# Patient Record
Sex: Male | Born: 1960
Health system: Southern US, Community
[De-identification: ages and names within clinical notes are randomized; demographics above are authoritative.]

## PROBLEM LIST (undated history)

## (undated) DIAGNOSIS — S7290XA Unspecified fracture of unspecified femur, initial encounter for closed fracture: Secondary | ICD-10-CM

## (undated) DIAGNOSIS — E785 Hyperlipidemia, unspecified: Secondary | ICD-10-CM

## (undated) DIAGNOSIS — I10 Essential (primary) hypertension: Secondary | ICD-10-CM

## (undated) HISTORY — DX: Hyperlipidemia, unspecified: E78.5

## (undated) HISTORY — PX: LASIK: SHX215

## (undated) HISTORY — DX: Unspecified fracture of unspecified femur, initial encounter for closed fracture: S72.90XA

---

## 2001-11-19 ENCOUNTER — Ambulatory Visit (HOSPITAL_COMMUNITY): Admission: RE | Admit: 2001-11-19 | Discharge: 2001-11-19 | Payer: Self-pay | Admitting: Orthopedic Surgery

## 2001-11-19 ENCOUNTER — Encounter: Payer: Self-pay | Admitting: Orthopedic Surgery

## 2004-04-21 ENCOUNTER — Ambulatory Visit: Payer: Self-pay | Admitting: Internal Medicine

## 2004-06-03 ENCOUNTER — Ambulatory Visit: Payer: Self-pay | Admitting: Internal Medicine

## 2005-03-03 ENCOUNTER — Ambulatory Visit: Payer: Self-pay | Admitting: Internal Medicine

## 2005-05-20 ENCOUNTER — Ambulatory Visit: Payer: Self-pay | Admitting: Internal Medicine

## 2006-11-25 ENCOUNTER — Telehealth (INDEPENDENT_AMBULATORY_CARE_PROVIDER_SITE_OTHER): Payer: Self-pay | Admitting: *Deleted

## 2007-02-21 ENCOUNTER — Telehealth (INDEPENDENT_AMBULATORY_CARE_PROVIDER_SITE_OTHER): Payer: Self-pay | Admitting: *Deleted

## 2007-06-08 ENCOUNTER — Telehealth (INDEPENDENT_AMBULATORY_CARE_PROVIDER_SITE_OTHER): Payer: Self-pay | Admitting: *Deleted

## 2007-09-05 ENCOUNTER — Telehealth (INDEPENDENT_AMBULATORY_CARE_PROVIDER_SITE_OTHER): Payer: Self-pay | Admitting: *Deleted

## 2007-11-29 ENCOUNTER — Ambulatory Visit: Payer: Self-pay | Admitting: Internal Medicine

## 2007-11-29 DIAGNOSIS — R5383 Other fatigue: Secondary | ICD-10-CM

## 2007-11-29 DIAGNOSIS — E785 Hyperlipidemia, unspecified: Secondary | ICD-10-CM | POA: Insufficient documentation

## 2007-11-29 DIAGNOSIS — R5381 Other malaise: Secondary | ICD-10-CM | POA: Insufficient documentation

## 2007-11-30 LAB — CONVERTED CEMR LAB
ALT: 27 units/L (ref 0–53)
AST: 29 units/L (ref 0–37)
Albumin: 3.7 g/dL (ref 3.5–5.2)
Alkaline Phosphatase: 60 units/L (ref 39–117)
BUN: 19 mg/dL (ref 6–23)
Basophils Absolute: 0 10*3/uL (ref 0.0–0.1)
Basophils Relative: 0.7 % (ref 0.0–3.0)
Bilirubin, Direct: 0.1 mg/dL (ref 0.0–0.3)
CO2: 28 meq/L (ref 19–32)
Calcium: 9.2 mg/dL (ref 8.4–10.5)
Chloride: 105 meq/L (ref 96–112)
Cholesterol: 274 mg/dL (ref 0–200)
Creatinine, Ser: 1.5 mg/dL (ref 0.4–1.5)
Direct LDL: 190.1 mg/dL
Eosinophils Absolute: 0.1 10*3/uL (ref 0.0–0.7)
Eosinophils Relative: 2.5 % (ref 0.0–5.0)
GFR calc Af Amer: 64 mL/min
GFR calc non Af Amer: 53 mL/min
Glucose, Bld: 99 mg/dL (ref 70–99)
HCT: 42.6 % (ref 39.0–52.0)
HDL: 33.3 mg/dL — ABNORMAL LOW (ref >39.0)
Hemoglobin: 14.4 g/dL (ref 13.0–17.0)
Lymphocytes Relative: 45.8 % (ref 12.0–46.0)
MCHC: 33.9 g/dL (ref 30.0–36.0)
MCV: 87.6 fL (ref 78.0–100.0)
Monocytes Absolute: 0.3 10*3/uL (ref 0.1–1.0)
Monocytes Relative: 8.7 % (ref 3.0–12.0)
Neutro Abs: 1.7 10*3/uL (ref 1.4–7.7)
Neutrophils Relative %: 42.3 % — ABNORMAL LOW (ref 43.0–77.0)
PSA: 1.7 ng/mL (ref 0.10–4.00)
Phosphorus: 3 mg/dL (ref 2.3–4.6)
Platelets: 183 10*3/uL (ref 150–400)
Potassium: 4.4 meq/L (ref 3.5–5.1)
RBC: 4.86 M/uL (ref 4.22–5.81)
RDW: 13.3 % (ref 11.5–14.6)
Sodium: 140 meq/L (ref 135–145)
TSH: 0.37 microintl units/mL (ref 0.35–5.50)
Total Bilirubin: 0.9 mg/dL (ref 0.3–1.2)
Total CHOL/HDL Ratio: 8.2
Total Protein: 7.1 g/dL (ref 6.0–8.3)
Triglycerides: 225 mg/dL (ref 0–149)
VLDL: 45 mg/dL — ABNORMAL HIGH (ref 0–40)
WBC: 4 10*3/uL — ABNORMAL LOW (ref 4.5–10.5)

## 2007-12-15 ENCOUNTER — Telehealth: Payer: Self-pay | Admitting: Family Medicine

## 2008-03-19 ENCOUNTER — Telehealth (INDEPENDENT_AMBULATORY_CARE_PROVIDER_SITE_OTHER): Payer: Self-pay | Admitting: *Deleted

## 2008-06-15 ENCOUNTER — Telehealth: Payer: Self-pay | Admitting: Internal Medicine

## 2008-09-19 ENCOUNTER — Telehealth: Payer: Self-pay | Admitting: Internal Medicine

## 2008-12-19 ENCOUNTER — Telehealth: Payer: Self-pay | Admitting: Internal Medicine

## 2009-04-15 ENCOUNTER — Telehealth: Payer: Self-pay | Admitting: Internal Medicine

## 2009-08-07 ENCOUNTER — Telehealth: Payer: Self-pay | Admitting: Internal Medicine

## 2009-09-17 ENCOUNTER — Ambulatory Visit: Payer: Self-pay | Admitting: Internal Medicine

## 2009-09-17 DIAGNOSIS — M549 Dorsalgia, unspecified: Secondary | ICD-10-CM | POA: Insufficient documentation

## 2009-11-18 ENCOUNTER — Telehealth: Payer: Self-pay | Admitting: Internal Medicine

## 2010-02-12 ENCOUNTER — Telehealth: Payer: Self-pay | Admitting: Internal Medicine

## 2010-05-12 ENCOUNTER — Telehealth: Payer: Self-pay | Admitting: Internal Medicine

## 2010-06-03 NOTE — Progress Notes (Signed)
Summary: percocet  Phone Note Refill Request Call back at Home Phone [phone] Message from:  Patient on November 18, 2009 10:17 AM  Refills Requested: Medication #1:  PERCOCET 5-325 MG  TABS 1 two times a day as needed for severe pain.  Method Requested: Pick up at Office Initial call taken by: Melody Comas,  November 18, 2009 10:17 AM  Follow-up for Phone Call        Rx written Follow-up by: Cindee Salt MD,  November 18, 2009 12:23 PM  Additional Follow-up for Phone Call Additional follow up Details #1::        Advised wife ok to pick up script. Additional Follow-up by: Lowella Petties CMA,  November 18, 2009 1:14 PM    Prescriptions: PERCOCET 5-325 MG  TABS (OXYCODONE-ACETAMINOPHEN) 1 two times a day as needed for severe pain  #60 x 0   Entered and Authorized by:   Cindee Salt MD   Signed by:   Cindee Salt MD on 11/18/2009   Method used:   Print then Give to Patient   RxID:   [id number]

## 2010-06-03 NOTE — Progress Notes (Signed)
Summary: refill request for percoct  Phone Note Refill Request Message from:  Patient  Refills Requested: Medication #1:  PERCOCET 5-325 MG  TABS 1 two times a day as needed for severe pain. Initial call taken by: Lowella Petties CMA,  February 12, 2010 12:52 PM  Follow-up for Phone Call        Rx written Follow-up by: Cindee Salt MD,  February 12, 2010 2:14 PM  Additional Follow-up for Phone Call Additional follow up Details #1::        Spoke with patient and advised rx ready for pick-up  Additional Follow-up by: Mervin Hack CMA Duncan Dull),  February 13, 2010 1:59 PM    New/Updated Medications: PERCOCET 5-325 MG  TABS (OXYCODONE-ACETAMINOPHEN) 1 two times a day as needed for severe pain Prescriptions: PERCOCET 5-325 MG  TABS (OXYCODONE-ACETAMINOPHEN) 1 two times a day as needed for severe pain  #60 x 0   Entered and Authorized by:   Cindee Salt MD   Signed by:   Cindee Salt MD on 02/12/2010   Method used:   Print then Give to Patient   RxID:   [id number]

## 2010-06-03 NOTE — Progress Notes (Signed)
Summary: refill request for percocet  Phone Note Refill Request Call back at [phone] Message from:  Patient  Refills Requested: Medication #1:  PERCOCET 5-325 MG  TABS 1 two times a day as needed for severe pain. Please call pt when ready.  Initial call taken by: Lowella Petties CMA,  August 07, 2009 2:00 PM  Follow-up for Phone Call        Rx written Last one until he schedules physical He is getting close to 2 years since last visit Follow-up by: Cindee Salt MD,  August 07, 2009 2:07 PM  Additional Follow-up for Phone Call Additional follow up Details #1::        left message on machine with results, no more refills until pt is seen. Left note on rx at front desk for pt to schedule appt when he pick-up script. DeShannon Smith CMA Duncan Dull)  August 07, 2009 2:43 PM     Prescriptions: PERCOCET 5-325 MG  TABS (OXYCODONE-ACETAMINOPHEN) 1 two times a day as needed for severe pain  #60 x 0   Entered and Authorized by:   Cindee Salt MD   Signed by:   Cindee Salt MD on 08/07/2009   Method used:   Print then Give to Patient   RxID:   [id number]
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
## 2010-06-05 NOTE — Progress Notes (Signed)
Summary: percocet   Phone Note Refill Request Call back at Home Phone [phone] Message from:  Patient on May 12, 2010 4:15 PM  Refills Requested: Medication #1:  PERCOCET 5-325 MG  TABS 1 two times a day as needed for severe pain. Please call patient when rx is ready.    Method Requested: Pick up at Office Initial call taken by: Melody Comas,  May 12, 2010 4:15 PM  Follow-up for Phone Call        Rx written needs to set up PE in the next few months Follow-up by: Cindee Salt MD,  May 13, 2010 9:04 AM  Additional Follow-up for Phone Call Additional follow up Details #1::        Spoke with patient's wife and advised results.  Additional Follow-up by: Mervin Hack CMA Duncan Dull),  May 13, 2010 9:43 AM    Prescriptions: PERCOCET 5-325 MG  TABS (OXYCODONE-ACETAMINOPHEN) 1 two times a day as needed for severe pain  #60 x 0   Entered and Authorized by:   Cindee Salt MD   Signed by:   Cindee Salt MD on 05/13/2010   Method used:   Print then Give to Patient   RxID:   [id number]

## 2010-06-20 ENCOUNTER — Encounter: Payer: Self-pay | Admitting: Internal Medicine

## 2010-06-20 DIAGNOSIS — Z0289 Encounter for other administrative examinations: Secondary | ICD-10-CM

## 2010-07-19 ENCOUNTER — Encounter: Payer: Self-pay | Admitting: Internal Medicine

## 2010-08-11 ENCOUNTER — Other Ambulatory Visit: Payer: Self-pay | Admitting: *Deleted

## 2010-08-11 MED ORDER — OXYCODONE-ACETAMINOPHEN 5-325 MG PO TABS: 1.0000 | ORAL_TABLET | Freq: Two times a day (BID) | ORAL | Status: DC | PRN

## 2010-08-11 NOTE — Telephone Encounter (Signed)
Spoke with patient's wife and advised that rx ready for pick-up

## 2010-08-11 NOTE — Telephone Encounter (Signed)
rx written

## 2010-09-05 ENCOUNTER — Encounter: Payer: Self-pay | Admitting: Internal Medicine

## 2010-09-05 ENCOUNTER — Ambulatory Visit (INDEPENDENT_AMBULATORY_CARE_PROVIDER_SITE_OTHER): Payer: BC Managed Care – PPO | Admitting: Internal Medicine

## 2010-09-05 VITALS — BP 123/80 | HR 58 | Temp 98.4°F | Ht 70.0 in | Wt 236.1 lb

## 2010-09-05 DIAGNOSIS — E785 Hyperlipidemia, unspecified: Secondary | ICD-10-CM

## 2010-09-05 DIAGNOSIS — Z0000 Encounter for general adult medical examination without abnormal findings: Secondary | ICD-10-CM

## 2010-09-05 DIAGNOSIS — M549 Dorsalgia, unspecified: Secondary | ICD-10-CM

## 2010-09-05 DIAGNOSIS — Z125 Encounter for screening for malignant neoplasm of prostate: Secondary | ICD-10-CM

## 2010-09-05 DIAGNOSIS — N529 Male erectile dysfunction, unspecified: Secondary | ICD-10-CM

## 2010-09-05 LAB — HEPATIC FUNCTION PANEL
ALT: 19 U/L (ref 0–53)
AST: 19 U/L (ref 0–37)
Albumin: 3.7 g/dL (ref 3.5–5.2)
Alkaline Phosphatase: 54 U/L (ref 39–117)
Bilirubin, Direct: 0 mg/dL (ref 0.0–0.3)
Total Protein: 6.6 g/dL (ref 6.0–8.3)

## 2010-09-05 LAB — CBC WITH DIFFERENTIAL/PLATELET
Basophils Absolute: 0 10*3/uL (ref 0.0–0.1)
Eosinophils Relative: 2.5 % (ref 0.0–5.0)
HCT: 39.2 % (ref 39.0–52.0)
Lymphs Abs: 1.7 10*3/uL (ref 0.7–4.0)
MCV: 85.3 fl (ref 78.0–100.0)
Monocytes Absolute: 0.3 10*3/uL (ref 0.1–1.0)
Monocytes Relative: 8.4 % (ref 3.0–12.0)
Neutrophils Relative %: 42 % — ABNORMAL LOW (ref 43.0–77.0)
Platelets: 177 10*3/uL (ref 150.0–400.0)
RDW: 14.3 % (ref 11.5–14.6)
WBC: 3.7 10*3/uL — ABNORMAL LOW (ref 4.5–10.5)

## 2010-09-05 LAB — LIPID PANEL
Cholesterol: 266 mg/dL — ABNORMAL HIGH (ref 0–200)
HDL: 39.8 mg/dL (ref >39.00)
Total CHOL/HDL Ratio: 7
Triglycerides: 147 mg/dL (ref 0.0–149.0)
VLDL: 29.4 mg/dL (ref 0.0–40.0)

## 2010-09-05 LAB — BASIC METABOLIC PANEL
Calcium: 8.9 mg/dL (ref 8.4–10.5)
GFR: 77.48 mL/min (ref >60.00)
Glucose, Bld: 94 mg/dL (ref 70–99)
Potassium: 3.9 mEq/L (ref 3.5–5.1)
Sodium: 141 mEq/L (ref 135–145)

## 2010-09-05 LAB — PSA: PSA: 1.77 ng/mL (ref 0.10–4.00)

## 2010-09-05 LAB — LDL CHOLESTEROL, DIRECT: Direct LDL: 198.1 mg/dL

## 2010-09-05 MED ORDER — VARDENAFIL HCL 20 MG PO TABS: 20.0000 mg | ORAL_TABLET | Freq: Every day | ORAL | Status: DC | PRN

## 2010-09-05 NOTE — Progress Notes (Signed)
Subjective:    Patient ID: Colton Miller, male    DOB: 1960/10/20, 50 y.o.   MRN: [id number]  HPI DOing well No new concerns  Still with intermittent left shoulder pain Sometimes with low back pain Occ numbness in fingers May need as much as 2-3 per day (mostly shoulder)  Weight up but now has lost some back again Stress with MILs recent breast cancer and treatments  Current outpatient prescriptions:oxyCODONE-acetaminophen (PERCOCET) 5-325 MG per tablet, Take 1 tablet by mouth 2 (two) times daily as needed for pain. 1 two times a day as needed for severe pain, Disp: 60 tablet, Rfl: 0  Past Medical History  Diagnosis Date  . Hyperlipidemia   . Femur fracture     Past Surgical History  Procedure Date  . Lasik     2004, then right redone 2008    Family History  Problem Relation Age of Onset  . Cancer Paternal Grandmother     lung cancer  . Diabetes Neg Hx   . Coronary artery disease Neg Hx     History   Social History  . Marital Status: Married    Spouse Name: N/A    Number of Children: 1  . Years of Education: N/A   Occupational History  . Banker     [employer]   Social History Main Topics  . Smoking status: Never Smoker   . Smokeless tobacco: Not on file  . Alcohol Use: No  . Drug Use: Not on file  . Sexually Active: Not on file   Other Topics Concern  . Not on file   Social History Narrative  . No narrative on file   Review of Systems  Constitutional: Negative for activity change and fatigue.       Wears seat belt Weight up 20# over the past few years  HENT: Positive for tinnitus. Negative for hearing loss, rhinorrhea, dental problem and postnasal drip.        No sig nasal allergies Regular with the dentist  Eyes: Negative for visual disturbance.       Occ dry eyes and blurriness in evening No diplopia or focal vision loss  Respiratory: Positive for cough.        Some cough with the pollen  Cardiovascular: Negative for chest pain,  palpitations and leg swelling.  Genitourinary: Negative for dysuria, urgency, decreased urine volume and difficulty urinating.       Some ED--interested in meds  Musculoskeletal: Positive for back pain and arthralgias. Negative for joint swelling.  Skin: Negative for rash.       No suspicious lesions  Neurological: Positive for weakness and numbness. Negative for dizziness, syncope and light-headedness.       Occ left arm symptoms related to the shoulder  Hematological: Negative for adenopathy. Does not bruise/bleed easily.  Psychiatric/Behavioral: Negative for sleep disturbance and dysphoric mood. The patient is not nervous/anxious.        Objective:   Physical Exam  Constitutional: He is oriented to person, place, and time. He appears well-developed and well-nourished. No distress.  HENT:  Head: Normocephalic and atraumatic.  Right Ear: External ear normal.  Left Ear: External ear normal.  Mouth/Throat: Oropharynx is clear and moist. No oropharyngeal exudate.       TMs normal  Eyes: Conjunctivae and EOM are normal. Pupils are equal, round, and reactive to light.       Fundi benign  Neck: Normal range of motion. Neck supple. No thyromegaly present.  Cardiovascular:  Normal rate, regular rhythm, normal heart sounds and intact distal pulses.  Exam reveals no gallop.   No murmur heard. Pulmonary/Chest: Effort normal and breath sounds normal. No respiratory distress. He has no wheezes. He has no rales.  Abdominal: Soft. He exhibits no mass. There is no tenderness.  Musculoskeletal: Normal range of motion. He exhibits no edema and no tenderness.  Lymphadenopathy:    He has no cervical adenopathy.  Neurological: He is alert and oriented to person, place, and time. He exhibits normal muscle tone.       Normal strength and gait  Skin: Skin is warm. No rash noted.  Psychiatric: He has a normal mood and affect. His behavior is normal. Judgment and thought content normal.           Assessment & Plan:

## 2010-11-03 ENCOUNTER — Other Ambulatory Visit: Payer: Self-pay | Admitting: *Deleted

## 2010-11-03 MED ORDER — OXYCODONE-ACETAMINOPHEN 5-325 MG PO TABS: 1.0000 | ORAL_TABLET | Freq: Two times a day (BID) | ORAL | Status: DC | PRN

## 2010-11-03 NOTE — Telephone Encounter (Signed)
Spoke with patient and advised results   

## 2011-01-02 ENCOUNTER — Ambulatory Visit: Payer: BC Managed Care – PPO | Admitting: Family Medicine

## 2011-02-11 ENCOUNTER — Other Ambulatory Visit: Payer: Self-pay | Admitting: *Deleted

## 2011-02-11 MED ORDER — OXYCODONE-ACETAMINOPHEN 5-325 MG PO TABS: 1.0000 | ORAL_TABLET | Freq: Two times a day (BID) | ORAL | Status: DC | PRN

## 2011-02-11 NOTE — Telephone Encounter (Signed)
Please call pt when ready, advised patient it will be Thursday afternoon.

## 2011-05-21 ENCOUNTER — Other Ambulatory Visit: Payer: Self-pay

## 2011-05-21 NOTE — Telephone Encounter (Signed)
Pt left v/m at 5:02pm requesting Percocet prescription. Pt request call when rx ready for pick up at [phone] or cell [phone]. Pt last seen 09/2010.Please advise.

## 2011-05-22 MED ORDER — OXYCODONE-ACETAMINOPHEN 5-325 MG PO TABS: 1.0000 | ORAL_TABLET | Freq: Two times a day (BID) | ORAL | Status: DC | PRN

## 2011-05-22 NOTE — Telephone Encounter (Signed)
Left message on machine that rx is ready for pick-up, and it will be at our front desk.  

## 2011-08-25 ENCOUNTER — Other Ambulatory Visit: Payer: Self-pay

## 2011-08-25 MED ORDER — OXYCODONE-ACETAMINOPHEN 5-325 MG PO TABS: 1.0000 | ORAL_TABLET | Freq: Two times a day (BID) | ORAL | Status: DC | PRN

## 2011-08-25 NOTE — Telephone Encounter (Signed)
Pt left v/m requesting written rx for Percocet. Pt last seen 09/05/10. Pt already scheduled CPX 09/08/11.Please call pt at [phone].

## 2011-08-26 NOTE — Telephone Encounter (Signed)
Spoke with patient and advised rx ready for pick-up and it will be at the front desk.  

## 2011-09-08 ENCOUNTER — Ambulatory Visit (INDEPENDENT_AMBULATORY_CARE_PROVIDER_SITE_OTHER): Payer: BC Managed Care – PPO | Admitting: Internal Medicine

## 2011-09-08 ENCOUNTER — Encounter: Payer: Self-pay | Admitting: Internal Medicine

## 2011-09-08 VITALS — BP 102/80 | HR 58 | Temp 98.1°F | Ht 69.0 in | Wt 232.0 lb

## 2011-09-08 DIAGNOSIS — M549 Dorsalgia, unspecified: Secondary | ICD-10-CM

## 2011-09-08 DIAGNOSIS — Z0000 Encounter for general adult medical examination without abnormal findings: Secondary | ICD-10-CM

## 2011-09-08 DIAGNOSIS — E785 Hyperlipidemia, unspecified: Secondary | ICD-10-CM

## 2011-09-08 LAB — LIPID PANEL
Cholesterol: 268 mg/dL — ABNORMAL HIGH (ref 0–200)
HDL: 39.9 mg/dL (ref >39.00)
Triglycerides: 304 mg/dL — ABNORMAL HIGH (ref 0.0–149.0)

## 2011-09-08 LAB — BASIC METABOLIC PANEL
BUN: 14 mg/dL (ref 6–23)
CO2: 26 mEq/L (ref 19–32)
Chloride: 108 mEq/L (ref 96–112)
Glucose, Bld: 93 mg/dL (ref 70–99)
Potassium: 4.1 mEq/L (ref 3.5–5.1)
Sodium: 142 mEq/L (ref 135–145)

## 2011-09-08 LAB — CBC WITH DIFFERENTIAL/PLATELET
Basophils Absolute: 0 10*3/uL (ref 0.0–0.1)
Eosinophils Absolute: 0.1 10*3/uL (ref 0.0–0.7)
HCT: 40.7 % (ref 39.0–52.0)
Hemoglobin: 13.8 g/dL (ref 13.0–17.0)
Lymphs Abs: 2 10*3/uL (ref 0.7–4.0)
MCHC: 33.8 g/dL (ref 30.0–36.0)
MCV: 85.5 fl (ref 78.0–100.0)
Monocytes Absolute: 0.3 10*3/uL (ref 0.1–1.0)
Monocytes Relative: 7.2 % (ref 3.0–12.0)
Neutro Abs: 1.7 10*3/uL (ref 1.4–7.7)
Platelets: 156 10*3/uL (ref 150.0–400.0)
RDW: 14.7 % — ABNORMAL HIGH (ref 11.5–14.6)

## 2011-09-08 LAB — LDL CHOLESTEROL, DIRECT: Direct LDL: 172.9 mg/dL

## 2011-09-08 LAB — HEPATIC FUNCTION PANEL
Albumin: 3.6 g/dL (ref 3.5–5.2)
Alkaline Phosphatase: 55 U/L (ref 39–117)
Total Protein: 6.6 g/dL (ref 6.0–8.3)

## 2011-09-08 MED ORDER — ATORVASTATIN CALCIUM 40 MG PO TABS: 40.0000 mg | ORAL_TABLET | Freq: Every day | ORAL | Status: DC

## 2011-09-08 NOTE — Patient Instructions (Signed)
Please set up blood work in about 6 weeks (lipid, hepatic--272.4) 

## 2011-09-08 NOTE — Assessment & Plan Note (Signed)
Fairly healthy Working on fitness but needs to continue Will check PSA

## 2011-09-08 NOTE — Assessment & Plan Note (Signed)
And left shoulder pain Uses otc NSAIDs then percocet occ

## 2011-09-08 NOTE — Assessment & Plan Note (Signed)
Discussed rx for primary prevention---he will start

## 2011-09-08 NOTE — Progress Notes (Signed)
Subjective:    Patient ID: Colton Miller, male    DOB: 03-Feb-1961, 51 y.o.   MRN: [id number]  HPI Feels well Trying to be more regular with exercise and eat smart No new concerns  Tried levitra----worked well Too expensive though  Chronic but intermittent left shoulder pain Back pain is there at times but not as bad Uses percocet once in a while Occ uses aleve--does this first  Current Outpatient Prescriptions on File Prior to Visit  Medication Sig Dispense Refill  . oxyCODONE-acetaminophen (PERCOCET) 5-325 MG per tablet Take 1 tablet by mouth 2 (two) times daily as needed.  60 tablet  0    No Known Allergies  Past Medical History  Diagnosis Date  . Hyperlipidemia   . Femur fracture     Past Surgical History  Procedure Date  . Lasik     2004, then right redone 2008    Family History  Problem Relation Age of Onset  . Cancer Paternal Grandmother     lung cancer  . Diabetes Neg Hx   . Coronary artery disease Neg Hx     History   Social History  . Marital Status: Married    Spouse Name: N/A    Number of Children: 1  . Years of Education: N/A   Occupational History  . Banker     [name]   Social History Main Topics  . Smoking status: Never Smoker   . Smokeless tobacco: Never Used  . Alcohol Use: No  . Drug Use: Not on file  . Sexually Active: Not on file   Other Topics Concern  . Not on file   Social History Narrative  . No narrative on file   Review of Systems  Constitutional: Negative for fatigue and unexpected weight change.       Wears seat belt  HENT: Negative for hearing loss, congestion, rhinorrhea, dental problem and tinnitus.        Sees dentist regularly  Eyes: Negative for visual disturbance.       No unilateral vision or diplopia Occ brief blurry vision  Respiratory: Negative for cough, chest tightness and shortness of breath.   Cardiovascular: Negative for chest pain, palpitations and leg swelling.  Gastrointestinal: Negative  for nausea, vomiting, abdominal pain, constipation and blood in stool.       No heart burn  Genitourinary: Negative for urgency, frequency and difficulty urinating.       Rare nocturia  Musculoskeletal: Positive for back pain and arthralgias. Negative for joint swelling.  Skin: Negative for rash.       No suspicious areas   Neurological: Negative for dizziness, syncope, weakness, light-headedness, numbness and headaches.  Hematological: Negative for adenopathy. Does not bruise/bleed easily.  Psychiatric/Behavioral: Negative for sleep disturbance and dysphoric mood. The patient is not nervous/anxious.        Objective:   Physical Exam  Constitutional: He is oriented to person, place, and time. He appears well-developed and well-nourished. No distress.  HENT:  Head: Normocephalic and atraumatic.  Right Ear: External ear normal.  Left Ear: External ear normal.  Mouth/Throat: Oropharynx is clear and moist. No oropharyngeal exudate.  Eyes: Conjunctivae and EOM are normal. Pupils are equal, round, and reactive to light.  Neck: Normal range of motion. Neck supple. No thyromegaly present.  Cardiovascular: Normal rate, regular rhythm, normal heart sounds and intact distal pulses.  Exam reveals no gallop.   No murmur heard. Pulmonary/Chest: Effort normal and breath sounds normal. No respiratory distress.  He has no wheezes. He has no rales.  Abdominal: Soft. There is no tenderness.  Musculoskeletal: Normal range of motion. He exhibits no edema and no tenderness.  Lymphadenopathy:    He has no cervical adenopathy.  Neurological: He is alert and oriented to person, place, and time.  Skin: No rash noted. No erythema.  Psychiatric: He has a normal mood and affect. His behavior is normal.          Assessment & Plan:

## 2011-09-10 ENCOUNTER — Encounter: Payer: Self-pay | Admitting: *Deleted

## 2011-09-14 ENCOUNTER — Other Ambulatory Visit: Payer: Self-pay | Admitting: Internal Medicine

## 2011-10-19 ENCOUNTER — Other Ambulatory Visit: Payer: Self-pay | Admitting: Internal Medicine

## 2011-10-19 DIAGNOSIS — E785 Hyperlipidemia, unspecified: Secondary | ICD-10-CM

## 2011-10-20 ENCOUNTER — Other Ambulatory Visit (INDEPENDENT_AMBULATORY_CARE_PROVIDER_SITE_OTHER): Payer: BC Managed Care – PPO

## 2011-10-20 DIAGNOSIS — E785 Hyperlipidemia, unspecified: Secondary | ICD-10-CM

## 2011-10-20 LAB — HEPATIC FUNCTION PANEL
ALT: 37 U/L (ref 0–53)
AST: 31 U/L (ref 0–37)
Albumin: 4.3 g/dL (ref 3.5–5.2)
Alkaline Phosphatase: 61 U/L (ref 39–117)
Bilirubin, Direct: 0.1 mg/dL (ref 0.0–0.3)
Total Bilirubin: 1.4 mg/dL — ABNORMAL HIGH (ref 0.3–1.2)
Total Protein: 7.8 g/dL (ref 6.0–8.3)

## 2011-10-20 LAB — LIPID PANEL
Cholesterol: 155 mg/dL (ref 0–200)
HDL: 48.3 mg/dL
LDL Cholesterol: 80 mg/dL (ref 0–99)
Total CHOL/HDL Ratio: 3
Triglycerides: 132 mg/dL (ref 0.0–149.0)
VLDL: 26.4 mg/dL (ref 0.0–40.0)

## 2011-10-21 ENCOUNTER — Encounter: Payer: Self-pay | Admitting: *Deleted

## 2011-11-27 ENCOUNTER — Other Ambulatory Visit: Payer: Self-pay | Admitting: Internal Medicine

## 2011-11-27 MED ORDER — OXYCODONE-ACETAMINOPHEN 5-325 MG PO TABS: 1.0000 | ORAL_TABLET | Freq: Two times a day (BID) | ORAL | Status: DC | PRN

## 2011-11-27 NOTE — Telephone Encounter (Signed)
LETVAK PATIENT 

## 2011-11-27 NOTE — Telephone Encounter (Signed)
Requesting refill for Percocet.  He is out of the prescription and needs it for the weekend.  Please call patient back.

## 2012-03-04 ENCOUNTER — Other Ambulatory Visit: Payer: Self-pay

## 2012-03-04 MED ORDER — OXYCODONE-ACETAMINOPHEN 5-325 MG PO TABS: 1.0000 | ORAL_TABLET | Freq: Two times a day (BID) | ORAL | Status: DC | PRN

## 2012-03-04 NOTE — Telephone Encounter (Signed)
Notified pt Rx ready for pick up 

## 2012-03-04 NOTE — Telephone Encounter (Signed)
Pt left v/m requesting rx percocet; call when ready for pick up. 

## 2012-03-25 ENCOUNTER — Emergency Department: Payer: Self-pay | Admitting: Emergency Medicine

## 2012-03-25 LAB — COMPREHENSIVE METABOLIC PANEL
Albumin: 4.2 g/dL (ref 3.4–5.0)
Alkaline Phosphatase: 80 U/L (ref 50–136)
Anion Gap: 8 (ref 7–16)
BUN: 14 mg/dL (ref 7–18)
Calcium, Total: 9.5 mg/dL (ref 8.5–10.1)
Chloride: 106 mmol/L (ref 98–107)
Co2: 26 mmol/L (ref 21–32)
Creatinine: 1.33 mg/dL — ABNORMAL HIGH (ref 0.60–1.30)
Glucose: 116 mg/dL — ABNORMAL HIGH (ref 65–99)
Potassium: 3.1 mmol/L — ABNORMAL LOW (ref 3.5–5.1)
SGOT(AST): 34 U/L (ref 15–37)
SGPT (ALT): 62 U/L (ref 12–78)
Total Protein: 8 g/dL (ref 6.4–8.2)

## 2012-03-25 LAB — CBC WITH DIFFERENTIAL/PLATELET
Basophil %: 0.7 %
Eosinophil #: 0 10*3/uL (ref 0.0–0.7)
Eosinophil %: 0.7 %
HCT: 42.1 % (ref 40.0–52.0)
HGB: 14.4 g/dL (ref 13.0–18.0)
Lymphocyte %: 55.4 %
MCH: 29.2 pg (ref 26.0–34.0)
MCHC: 34.1 g/dL (ref 32.0–36.0)
Monocyte %: 7.8 %
Neutrophil #: 2.3 10*3/uL (ref 1.4–6.5)
Neutrophil %: 35.4 %
RBC: 4.93 10*6/uL (ref 4.40–5.90)
WBC: 6.6 10*3/uL (ref 3.8–10.6)

## 2012-03-25 LAB — LIPASE, BLOOD: Lipase: 144 U/L (ref 73–393)

## 2012-03-28 ENCOUNTER — Ambulatory Visit (INDEPENDENT_AMBULATORY_CARE_PROVIDER_SITE_OTHER): Payer: BC Managed Care – PPO | Admitting: Internal Medicine

## 2012-03-28 ENCOUNTER — Encounter: Payer: Self-pay | Admitting: Internal Medicine

## 2012-03-28 ENCOUNTER — Encounter: Payer: Self-pay | Admitting: *Deleted

## 2012-03-28 VITALS — BP 120/90 | HR 66 | Temp 97.9°F | Wt 225.0 lb

## 2012-03-28 DIAGNOSIS — S139XXA Sprain of joints and ligaments of unspecified parts of neck, initial encounter: Secondary | ICD-10-CM

## 2012-03-28 MED ORDER — METHOCARBAMOL 750 MG PO TABS: 750.0000 mg | ORAL_TABLET | Freq: Four times a day (QID) | ORAL | Status: DC | PRN
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
## 2012-06-23 ENCOUNTER — Other Ambulatory Visit: Payer: Self-pay

## 2012-06-23 MED ORDER — OXYCODONE-ACETAMINOPHEN 5-325 MG PO TABS: 1.0000 | ORAL_TABLET | Freq: Two times a day (BID) | ORAL | Status: DC | PRN

## 2012-06-23 NOTE — Telephone Encounter (Signed)
Spoke with patient and advised rx ready for pick-up and it will be at the front desk.  

## 2012-06-23 NOTE — Telephone Encounter (Signed)
Pt left vm requesting rx percocet. Call when ready for pick up. 

## 2012-09-16 ENCOUNTER — Encounter: Payer: Self-pay | Admitting: Internal Medicine

## 2012-09-16 ENCOUNTER — Ambulatory Visit (INDEPENDENT_AMBULATORY_CARE_PROVIDER_SITE_OTHER): Payer: BC Managed Care – PPO | Admitting: Internal Medicine

## 2012-09-16 VITALS — BP 110/70 | HR 81 | Temp 98.7°F | Ht 69.0 in | Wt 208.5 lb

## 2012-09-16 DIAGNOSIS — M549 Dorsalgia, unspecified: Secondary | ICD-10-CM

## 2012-09-16 DIAGNOSIS — Z0000 Encounter for general adult medical examination without abnormal findings: Secondary | ICD-10-CM

## 2012-09-16 DIAGNOSIS — E785 Hyperlipidemia, unspecified: Secondary | ICD-10-CM

## 2012-09-16 LAB — BASIC METABOLIC PANEL
CO2: 28 mEq/L (ref 19–32)
Chloride: 105 mEq/L (ref 96–112)
GFR: 70.39 mL/min (ref >60.00)
Glucose, Bld: 93 mg/dL (ref 70–99)
Potassium: 3.6 mEq/L (ref 3.5–5.1)
Sodium: 140 mEq/L (ref 135–145)

## 2012-09-16 LAB — CBC WITH DIFFERENTIAL/PLATELET
Basophils Absolute: 0 10*3/uL (ref 0.0–0.1)
HCT: 44.8 % (ref 39.0–52.0)
Hemoglobin: 15.1 g/dL (ref 13.0–17.0)
Lymphs Abs: 1.8 10*3/uL (ref 0.7–4.0)
MCV: 85.1 fl (ref 78.0–100.0)
Monocytes Absolute: 0.4 10*3/uL (ref 0.1–1.0)
Monocytes Relative: 9 % (ref 3.0–12.0)
Neutro Abs: 2 10*3/uL (ref 1.4–7.7)
RDW: 14 % (ref 11.5–14.6)

## 2012-09-16 LAB — LIPID PANEL
Cholesterol: 172 mg/dL (ref 0–200)
LDL Cholesterol: 100 mg/dL — ABNORMAL HIGH (ref 0–99)

## 2012-09-16 LAB — HEPATIC FUNCTION PANEL
AST: 18 U/L (ref 0–37)
Albumin: 4.3 g/dL (ref 3.5–5.2)
Alkaline Phosphatase: 58 U/L (ref 39–117)
Total Protein: 7.6 g/dL (ref 6.0–8.3)

## 2012-09-16 NOTE — Assessment & Plan Note (Signed)
Physically healthy but emotional distress Recommended counseling through job's EAP Will defer PSA this year since normal last year

## 2012-09-16 NOTE — Assessment & Plan Note (Signed)
Still needs the percocet intermittently

## 2012-09-16 NOTE — Assessment & Plan Note (Signed)
No problems with the med Will check labs 

## 2012-09-16 NOTE — Progress Notes (Signed)
Subjective:    Patient ID: Col[place] Miller, male    DOB: [birth year], 52 y.o.   MRN: [id number]  HPI Having some marital issues "my wife is like a different person" since change in chronic narcotics Family strife --- blame going on him that she had to move mother back to [place] She is accusing him of having an affair at work (one of his team members that he has to correspond with) She has not been willing to go for counseling (even their pastor)  Has lost a fair bit of weight Stress, etc Not much exercise  Current Outpatient Prescriptions on File Prior to Visit  Medication Sig Dispense Refill  . atorvastatin (LIPITOR) 40 MG tablet Take 1 tablet (40 mg total) by mouth daily.  30 tablet  11  . methocarbamol (ROBAXIN) 750 MG tablet Take 1 tablet (750 mg total) by mouth 4 (four) times daily as needed.  60 tablet  1  . oxyCODONE-acetaminophen (PERCOCET/ROXICET) 5-325 MG per tablet Take 1 tablet by mouth 2 (two) times daily as needed.  60 tablet  0   No current facility-administered medications on file prior to visit.    No Known Allergies  Past Medical History  Diagnosis Date  . Hyperlipidemia   . Femur fracture     Past Surgical History  Procedure Laterality Date  . Lasik      2004, then right redone 2008    Family History  Problem Relation Age of Onset  . Cancer Paternal Grandmother     lung cancer  . Diabetes Neg Hx   . Coronary artery disease Neg Hx     History   Social History  . Marital Status: Married    Spouse Name: N/A    Number of Children: 1  . Years of Education: N/A   Occupational History  . Banker     [place]   Social History Main Topics  . Smoking status: Never Smoker   . Smokeless tobacco: Never Used  . Alcohol Use: No  . Drug Use: Not on file  . Sexually Active: Not on file   Other Topics Concern  . Not on file   Social History Narrative  . No narrative on file   Review of Systems  Constitutional: Positive for fatigue and  unexpected weight change.       Has not felt right with all this stress Wears seat belt  HENT: Positive for tinnitus. Negative for hearing loss, rhinorrhea, dental problem and postnasal drip.        Regular with dentist  Eyes: Negative for visual disturbance.       No unilateral vision loss but gets some blurry vision at times Has seen eye doctor and all was fine  Respiratory: Negative for cough, chest tightness and shortness of breath.   Cardiovascular: Positive for palpitations. Negative for chest pain and leg swelling.       Some palpitations with stress  Gastrointestinal: Negative for nausea, vomiting, abdominal pain, constipation and blood in stool.       No heartburn problems  Endocrine: Negative for cold intolerance and heat intolerance.  Genitourinary: Positive for difficulty urinating. Negative for urgency.       Some dribbling  No sexual problems  Musculoskeletal: Positive for back pain and arthralgias.  Skin: Negative for rash.       No suspicious lesions  Allergic/Immunologic: Negative for environmental allergies and immunocompromised state.  Neurological: Positive for dizziness, numbness and headaches. Negative for syncope and  light-headedness.       Dizziness with stress Some right hand numbness  Hematological: Negative for adenopathy. Does not bruise/bleed easily.  Psychiatric/Behavioral: Positive for sleep disturbance and dysphoric mood. The patient is nervous/anxious.        Stress is affecting job, church, sleep, etc       Objective:   Physical Exam  Constitutional: He is oriented to person, place, and time. He appears well-developed and well-nourished. No distress.  HENT:  Head: Normocephalic and atraumatic.  Right Ear: External ear normal.  Left Ear: External ear normal.  Mouth/Throat: Oropharynx is clear and moist. No oropharyngeal exudate.  Eyes: Conjunctivae and EOM are normal. Pupils are equal, round, and reactive to light.  Neck: Normal range of motion.  Neck supple. No thyromegaly present.  Cardiovascular: Normal rate, regular rhythm, normal heart sounds and intact distal pulses.  Exam reveals no gallop.   No murmur heard. Pulmonary/Chest: Effort normal and breath sounds normal. No respiratory distress. He has no wheezes. He has no rales.  Abdominal: Soft. There is no tenderness.  Musculoskeletal: He exhibits no edema and no tenderness.  Lymphadenopathy:    He has no cervical adenopathy.  Neurological: He is alert and oriented to person, place, and time.  Skin: No rash noted. No erythema.  Psychiatric:  Anxious and distressed over marital problems          Assessment & Plan:

## 2012-09-20 ENCOUNTER — Other Ambulatory Visit: Payer: Self-pay

## 2012-09-20 NOTE — Telephone Encounter (Signed)
pts wife request rx oxycodone apap. Call when ready for pick up.

## 2012-09-20 NOTE — Telephone Encounter (Signed)
Please confirm with him that he is ready for a new prescription I will print it tomorrow after you have spoken to him

## 2012-09-21 MED ORDER — OXYCODONE-ACETAMINOPHEN 5-325 MG PO TABS: 1.0000 | ORAL_TABLET | Freq: Two times a day (BID) | ORAL | Status: DC | PRN

## 2012-09-21 NOTE — Telephone Encounter (Signed)
Yes patient confirmed he needed a rx

## 2012-09-21 NOTE — Telephone Encounter (Signed)
Spoke with patient and advised rx ready for pick-up and it will be at the front desk.  

## 2012-11-11 ENCOUNTER — Other Ambulatory Visit: Payer: Self-pay | Admitting: Internal Medicine

## 2012-12-08 ENCOUNTER — Other Ambulatory Visit: Payer: Self-pay | Admitting: Internal Medicine

## 2013-09-01 ENCOUNTER — Other Ambulatory Visit: Payer: Self-pay | Admitting: Family Medicine

## 2013-09-01 NOTE — Telephone Encounter (Signed)
Dr. Alphonsus SiasLetvak out of office, please advise.  Last filled 09/20/2012.

## 2013-09-03 MED ORDER — OXYCODONE-ACETAMINOPHEN 5-325 MG PO TABS: 1.0000 | ORAL_TABLET | Freq: Two times a day (BID) | ORAL | Status: DC | PRN

## 2013-09-03 NOTE — Telephone Encounter (Signed)
Printed, routed to PCP as FYI.  

## 2013-09-04 NOTE — Telephone Encounter (Signed)
Left message on machine that rx is ready for pick-up, and it will be at our front desk.  

## 2013-09-04 NOTE — Telephone Encounter (Signed)
Was not able to reach patient to notify Rx was ready for pick up.  Rx given to [place].

## 2013-09-19 ENCOUNTER — Ambulatory Visit (INDEPENDENT_AMBULATORY_CARE_PROVIDER_SITE_OTHER): Payer: BC Managed Care – PPO | Admitting: Internal Medicine

## 2013-09-19 ENCOUNTER — Encounter: Payer: Self-pay | Admitting: Internal Medicine

## 2013-09-19 VITALS — BP 110/70 | HR 71 | Temp 97.9°F | Ht 69.0 in | Wt 209.0 lb

## 2013-09-19 DIAGNOSIS — M549 Dorsalgia, unspecified: Secondary | ICD-10-CM

## 2013-09-19 DIAGNOSIS — E785 Hyperlipidemia, unspecified: Secondary | ICD-10-CM

## 2013-09-19 DIAGNOSIS — Z0000 Encounter for general adult medical examination without abnormal findings: Secondary | ICD-10-CM

## 2013-09-19 DIAGNOSIS — Z125 Encounter for screening for malignant neoplasm of prostate: Secondary | ICD-10-CM

## 2013-09-19 LAB — CBC WITH DIFFERENTIAL/PLATELET
BASOS ABS: 0 10*3/uL (ref 0.0–0.1)
Basophils Relative: 0.6 % (ref 0.0–3.0)
EOS PCT: 2.1 % (ref 0.0–5.0)
Eosinophils Absolute: 0.1 10*3/uL (ref 0.0–0.7)
HCT: 43.1 % (ref 39.0–52.0)
Hemoglobin: 14.3 g/dL (ref 13.0–17.0)
LYMPHS ABS: 1.9 10*3/uL (ref 0.7–4.0)
LYMPHS PCT: 48.7 % — AB (ref 12.0–46.0)
MCHC: 33.2 g/dL (ref 30.0–36.0)
MCV: 86.7 fl (ref 78.0–100.0)
MONOS PCT: 8.7 % (ref 3.0–12.0)
Monocytes Absolute: 0.3 10*3/uL (ref 0.1–1.0)
NEUTROS PCT: 39.9 % — AB (ref 43.0–77.0)
Neutro Abs: 1.6 10*3/uL (ref 1.4–7.7)
PLATELETS: 205 10*3/uL (ref 150.0–400.0)
RBC: 4.97 Mil/uL (ref 4.22–5.81)
RDW: 14.3 % (ref 11.5–15.5)
WBC: 3.9 10*3/uL — AB (ref 4.0–10.5)

## 2013-09-19 LAB — LIPID PANEL
Cholesterol: 254 mg/dL — ABNORMAL HIGH (ref 0–200)
HDL: 47.6 mg/dL
LDL Cholesterol: 181 mg/dL — ABNORMAL HIGH (ref 0–99)
Total CHOL/HDL Ratio: 5
Triglycerides: 128 mg/dL (ref 0.0–149.0)
VLDL: 25.6 mg/dL (ref 0.0–40.0)

## 2013-09-19 LAB — COMPREHENSIVE METABOLIC PANEL
ALBUMIN: 4.1 g/dL (ref 3.5–5.2)
ALT: 24 U/L (ref 0–53)
AST: 23 U/L (ref 0–37)
Alkaline Phosphatase: 53 U/L (ref 39–117)
BILIRUBIN TOTAL: 1.1 mg/dL (ref 0.2–1.2)
BUN: 15 mg/dL (ref 6–23)
CALCIUM: 9.2 mg/dL (ref 8.4–10.5)
CHLORIDE: 104 meq/L (ref 96–112)
CO2: 28 meq/L (ref 19–32)
Creatinine, Ser: 1.3 mg/dL (ref 0.4–1.5)
GFR: 71.33 mL/min (ref >60.00)
GLUCOSE: 84 mg/dL (ref 70–99)
Potassium: 4.3 mEq/L (ref 3.5–5.1)
SODIUM: 138 meq/L (ref 135–145)
TOTAL PROTEIN: 7 g/dL (ref 6.0–8.3)

## 2013-09-19 LAB — T4, FREE: Free T4: 1.01 ng/dL (ref 0.60–1.60)

## 2013-09-19 LAB — PSA: PSA: 3.73 ng/mL (ref 0.10–4.00)

## 2013-09-19 LAB — TSH: TSH: 0.52 u[IU]/mL (ref 0.35–4.50)

## 2013-09-19 NOTE — Assessment & Plan Note (Signed)
Discussed core strengthening

## 2013-09-19 NOTE — Assessment & Plan Note (Signed)
Generally healthy Eating better Needs to get back to exercising Will check PSA

## 2013-09-19 NOTE — Progress Notes (Signed)
Subjective:    Patient ID: Miachel RouxHerman A Miller, male    DOB: [birth year], 53 y.o.   MRN: [id number]  HPI Here for physical  Still lots of stress with wife Still accusing, paranoid at times---ongoing stress (legal and otherwise) Changed jobs-no interaction with the woman he had been texting (work related) for 1.5 years Trying to eat right--has stopped most fried foods Not exercising much  Episodic back pain Mostly left lumbar  Will get in jacuzzi, and rest Uses the oxycodone once in a while  Hasn't been on the statin in some time Didn't have refills   Current Outpatient Prescriptions on File Prior to Visit  Medication Sig Dispense Refill  . atorvastatin (LIPITOR) 40 MG tablet TAKE 1 TABLET BY MOUTH DAILY  30 tablet  5  . oxyCODONE-acetaminophen (PERCOCET/ROXICET) 5-325 MG per tablet Take 1 tablet by mouth 2 (two) times daily as needed.  60 tablet  0   No current facility-administered medications on file prior to visit.    No Known Allergies  Past Medical History  Diagnosis Date  . Hyperlipidemia   . Femur fracture     Past Surgical History  Procedure Laterality Date  . Lasik      2004, then right redone 2008    Family History  Problem Relation Age of Onset  . Cancer Paternal Grandmother     lung cancer  . Diabetes Neg Hx   . Coronary artery disease Neg Hx     History   Social History  . Marital Status: Married    Spouse Name: N/A    Number of Children: 1  . Years of Education: N/A   Occupational History  . Banker     [employer]   Social History Main Topics  . Smoking status: Never Smoker   . Smokeless tobacco: Never Used  . Alcohol Use: No  . Drug Use: Not on file  . Sexual Activity: Not on file   Other Topics Concern  . Not on file   Social History Narrative  . No narrative on file   Review of Systems  Constitutional: Negative for fatigue and unexpected weight change.       Wears seat belt  HENT: Negative for dental problem, hearing  loss and tinnitus.        Regular with dentist  Eyes: Negative for visual disturbance.       No diplopia or unilateral vision loss  Respiratory: Negative for cough, chest tightness and shortness of breath.   Cardiovascular: Negative for chest pain, palpitations and leg swelling.  Gastrointestinal: Negative for nausea, vomiting, abdominal pain, constipation and blood in stool.  Endocrine: Negative for cold intolerance and heat intolerance.  Genitourinary: Negative for urgency, frequency and difficulty urinating.       No sexual problems  Musculoskeletal: Positive for back pain. Negative for arthralgias and joint swelling.  Skin: Negative for rash.       No suspicious lesions  Allergic/Immunologic: Negative for environmental allergies and immunocompromised state.  Neurological: Positive for headaches. Negative for dizziness, syncope, weakness, light-headedness and numbness.       Stress headaches at times  Hematological: Negative for adenopathy. Does not bruise/bleed easily.  Psychiatric/Behavioral: Positive for dysphoric mood. Negative for sleep disturbance. The patient is not nervous/anxious.        Mood is an issue due to wife's behavior       Objective:   Physical Exam  Constitutional: He is oriented to person, place, and time. He appears  well-developed and well-nourished. No distress.  HENT:  Head: Normocephalic and atraumatic.  Right Ear: External ear normal.  Left Ear: External ear normal.  Mouth/Throat: Oropharynx is clear and moist. No oropharyngeal exudate.  Eyes: Conjunctivae and EOM are normal. Pupils are equal, round, and reactive to light.  Neck: Normal range of motion. Neck supple. No thyromegaly present.  Cardiovascular: Normal rate, regular rhythm, normal heart sounds and intact distal pulses.  Exam reveals no gallop.   No murmur heard. Pulmonary/Chest: Effort normal and breath sounds normal. No respiratory distress. He has no wheezes. He has no rales.  Abdominal:  Soft. There is no tenderness.  Musculoskeletal: He exhibits no edema and no tenderness.  Lymphadenopathy:    He has no cervical adenopathy.  Neurological: He is alert and oriented to person, place, and time.  Skin: No rash noted. No erythema.  Psychiatric: He has a normal mood and affect. His behavior is normal.          Assessment & Plan:

## 2013-09-19 NOTE — Progress Notes (Signed)
Pre visit review using our clinic review tool, if applicable. No additional management support is needed unless otherwise documented below in the visit note. 

## 2013-09-19 NOTE — Assessment & Plan Note (Signed)
Has been off the statin Will recheck labs Restart if LDL over 160

## 2013-09-27 ENCOUNTER — Encounter: Payer: Self-pay | Admitting: *Deleted

## 2014-02-02 ENCOUNTER — Other Ambulatory Visit: Payer: Self-pay

## 2014-02-02 NOTE — Telephone Encounter (Signed)
Pt left v/m requesting rx percocet. Call when ready for pick up.

## 2014-02-05 MED ORDER — OXYCODONE-ACETAMINOPHEN 5-325 MG PO TABS: 1.0000 | ORAL_TABLET | Freq: Two times a day (BID) | ORAL | Status: DC | PRN

## 2014-02-05 NOTE — Telephone Encounter (Signed)
Pt left v/m requesting status of rx. Left v/m requesting cb. rx has been placed at front desk for pick up.

## 2014-02-05 NOTE — Telephone Encounter (Signed)
Pt called back and notified that rx is at front desk for pick up; pt voiced understanding.

## 2014-06-04 ENCOUNTER — Other Ambulatory Visit: Payer: Self-pay | Admitting: *Deleted

## 2014-06-04 NOTE — Telephone Encounter (Signed)
Patient left a voicemail requesting a refill on Percocet. Last refill 02/05/14 #60. Call when ready for pickup.

## 2014-06-05 MED ORDER — OXYCODONE-ACETAMINOPHEN 5-325 MG PO TABS: 1.0000 | ORAL_TABLET | Freq: Two times a day (BID) | ORAL | Status: DC | PRN

## 2014-06-05 NOTE — Telephone Encounter (Signed)
Patient notified by telephone that script is up front ready for pickup. 

## 2014-08-22 ENCOUNTER — Other Ambulatory Visit: Payer: Self-pay | Admitting: *Deleted

## 2014-08-22 MED ORDER — OXYCODONE-ACETAMINOPHEN 5-325 MG PO TABS: 1.0000 | ORAL_TABLET | Freq: Two times a day (BID) | ORAL | Status: DC | PRN

## 2014-08-22 NOTE — Telephone Encounter (Signed)
Spoke with patient and advised rx ready for pick-up and it will be at the front desk.  

## 2014-08-22 NOTE — Telephone Encounter (Signed)
Pt left voicemail with Triage requesting Rx refill

## 2014-09-21 ENCOUNTER — Encounter: Payer: BLUE CROSS/BLUE SHIELD | Admitting: Internal Medicine

## 2014-12-27 ENCOUNTER — Other Ambulatory Visit: Payer: Self-pay

## 2014-12-27 MED ORDER — OXYCODONE-ACETAMINOPHEN 5-325 MG PO TABS: 1.0000 | ORAL_TABLET | Freq: Two times a day (BID) | ORAL | Status: DC | PRN

## 2014-12-27 NOTE — Telephone Encounter (Signed)
Spoke with patient and advised rx ready for pick-up and it will be at the front desk.  

## 2014-12-27 NOTE — Telephone Encounter (Signed)
Pt left v/m requesting rx oxycodone apap. Call when ready for pick up. rx last printed # 60 on 08/22/14.last seen annual exam on 09/19/13. Pt scheduled CPX on 05/22/2015.

## 2015-05-22 ENCOUNTER — Encounter: Payer: BLUE CROSS/BLUE SHIELD | Admitting: Internal Medicine

## 2015-10-23 ENCOUNTER — Emergency Department (HOSPITAL_COMMUNITY): Payer: BLUE CROSS/BLUE SHIELD

## 2015-10-23 ENCOUNTER — Other Ambulatory Visit: Payer: Self-pay

## 2015-10-23 ENCOUNTER — Encounter (HOSPITAL_COMMUNITY): Payer: Self-pay | Admitting: Vascular Surgery

## 2015-10-23 ENCOUNTER — Emergency Department (HOSPITAL_COMMUNITY)
Admission: EM | Admit: 2015-10-23 | Discharge: 2015-10-23 | Disposition: A | Discharge status: Home / Self Care | Payer: BLUE CROSS/BLUE SHIELD | Attending: Emergency Medicine | Admitting: Emergency Medicine

## 2015-10-23 DIAGNOSIS — R531 Weakness: Secondary | ICD-10-CM | POA: Diagnosis not present

## 2015-10-23 DIAGNOSIS — R51 Headache: Secondary | ICD-10-CM | POA: Diagnosis not present

## 2015-10-23 DIAGNOSIS — K2970 Gastritis, unspecified, without bleeding: Secondary | ICD-10-CM | POA: Diagnosis not present

## 2015-10-23 DIAGNOSIS — M542 Cervicalgia: Secondary | ICD-10-CM | POA: Diagnosis not present

## 2015-10-23 DIAGNOSIS — R200 Anesthesia of skin: Secondary | ICD-10-CM | POA: Diagnosis not present

## 2015-10-23 DIAGNOSIS — R1033 Periumbilical pain: Secondary | ICD-10-CM | POA: Diagnosis not present

## 2015-10-23 DIAGNOSIS — R1013 Epigastric pain: Secondary | ICD-10-CM | POA: Insufficient documentation

## 2015-10-23 DIAGNOSIS — Z7982 Long term (current) use of aspirin: Secondary | ICD-10-CM | POA: Insufficient documentation

## 2015-10-23 DIAGNOSIS — R0602 Shortness of breath: Secondary | ICD-10-CM | POA: Diagnosis not present

## 2015-10-23 DIAGNOSIS — R110 Nausea: Secondary | ICD-10-CM | POA: Diagnosis not present

## 2015-10-23 DIAGNOSIS — R109 Unspecified abdominal pain: Secondary | ICD-10-CM

## 2015-10-23 LAB — COMPREHENSIVE METABOLIC PANEL
ALK PHOS: 58 U/L (ref 38–126)
ALT: 24 U/L (ref 17–63)
AST: 27 U/L (ref 15–41)
Albumin: 3.8 g/dL (ref 3.5–5.0)
Anion gap: 9 (ref 5–15)
BILIRUBIN TOTAL: 1.1 mg/dL (ref 0.3–1.2)
BUN: 8 mg/dL (ref 6–20)
CHLORIDE: 101 mmol/L (ref 101–111)
CO2: 26 mmol/L (ref 22–32)
CREATININE: 1.42 mg/dL — AB (ref 0.61–1.24)
Calcium: 9.3 mg/dL (ref 8.9–10.3)
GFR calc Af Amer: >60 mL/min (ref >60)
GFR, EST NON AFRICAN AMERICAN: 54 mL/min — AB (ref >60)
GLUCOSE: 108 mg/dL — AB (ref 65–99)
POTASSIUM: 3.3 mmol/L — AB (ref 3.5–5.1)
Sodium: 136 mmol/L (ref 135–145)
TOTAL PROTEIN: 7.4 g/dL (ref 6.5–8.1)

## 2015-10-23 LAB — CBC WITH DIFFERENTIAL/PLATELET
Basophils Absolute: 0 10*3/uL (ref 0.0–0.1)
Basophils Relative: 0 %
Eosinophils Absolute: 0 10*3/uL (ref 0.0–0.7)
Eosinophils Relative: 0 %
HCT: 44.2 % (ref 39.0–52.0)
HEMOGLOBIN: 14.3 g/dL (ref 13.0–17.0)
LYMPHS ABS: 1.4 10*3/uL (ref 0.7–4.0)
LYMPHS PCT: 15 %
MCH: 27.7 pg (ref 26.0–34.0)
MCHC: 32.4 g/dL (ref 30.0–36.0)
MCV: 85.5 fL (ref 78.0–100.0)
MONOS PCT: 12 %
Monocytes Absolute: 1.1 10*3/uL — ABNORMAL HIGH (ref 0.1–1.0)
NEUTROS PCT: 73 %
Neutro Abs: 6.8 10*3/uL (ref 1.7–7.7)
Platelets: 169 10*3/uL (ref 150–400)
RBC: 5.17 MIL/uL (ref 4.22–5.81)
RDW: 14.2 % (ref 11.5–15.5)
WBC: 9.3 10*3/uL (ref 4.0–10.5)

## 2015-10-23 LAB — I-STAT TROPONIN, ED: Troponin i, poc: 0 ng/mL (ref 0.00–0.08)

## 2015-10-23 IMAGING — DX DG CHEST 2V
2 series · 2 of 2 positions shown · non-contrast
Comparison: [DATE]

CLINICAL DATA: Epigastric pain radiating to the left shoulder
starting yesterday. Shortness of breath.

EXAM:
CHEST  2 VIEW

[chest pa]
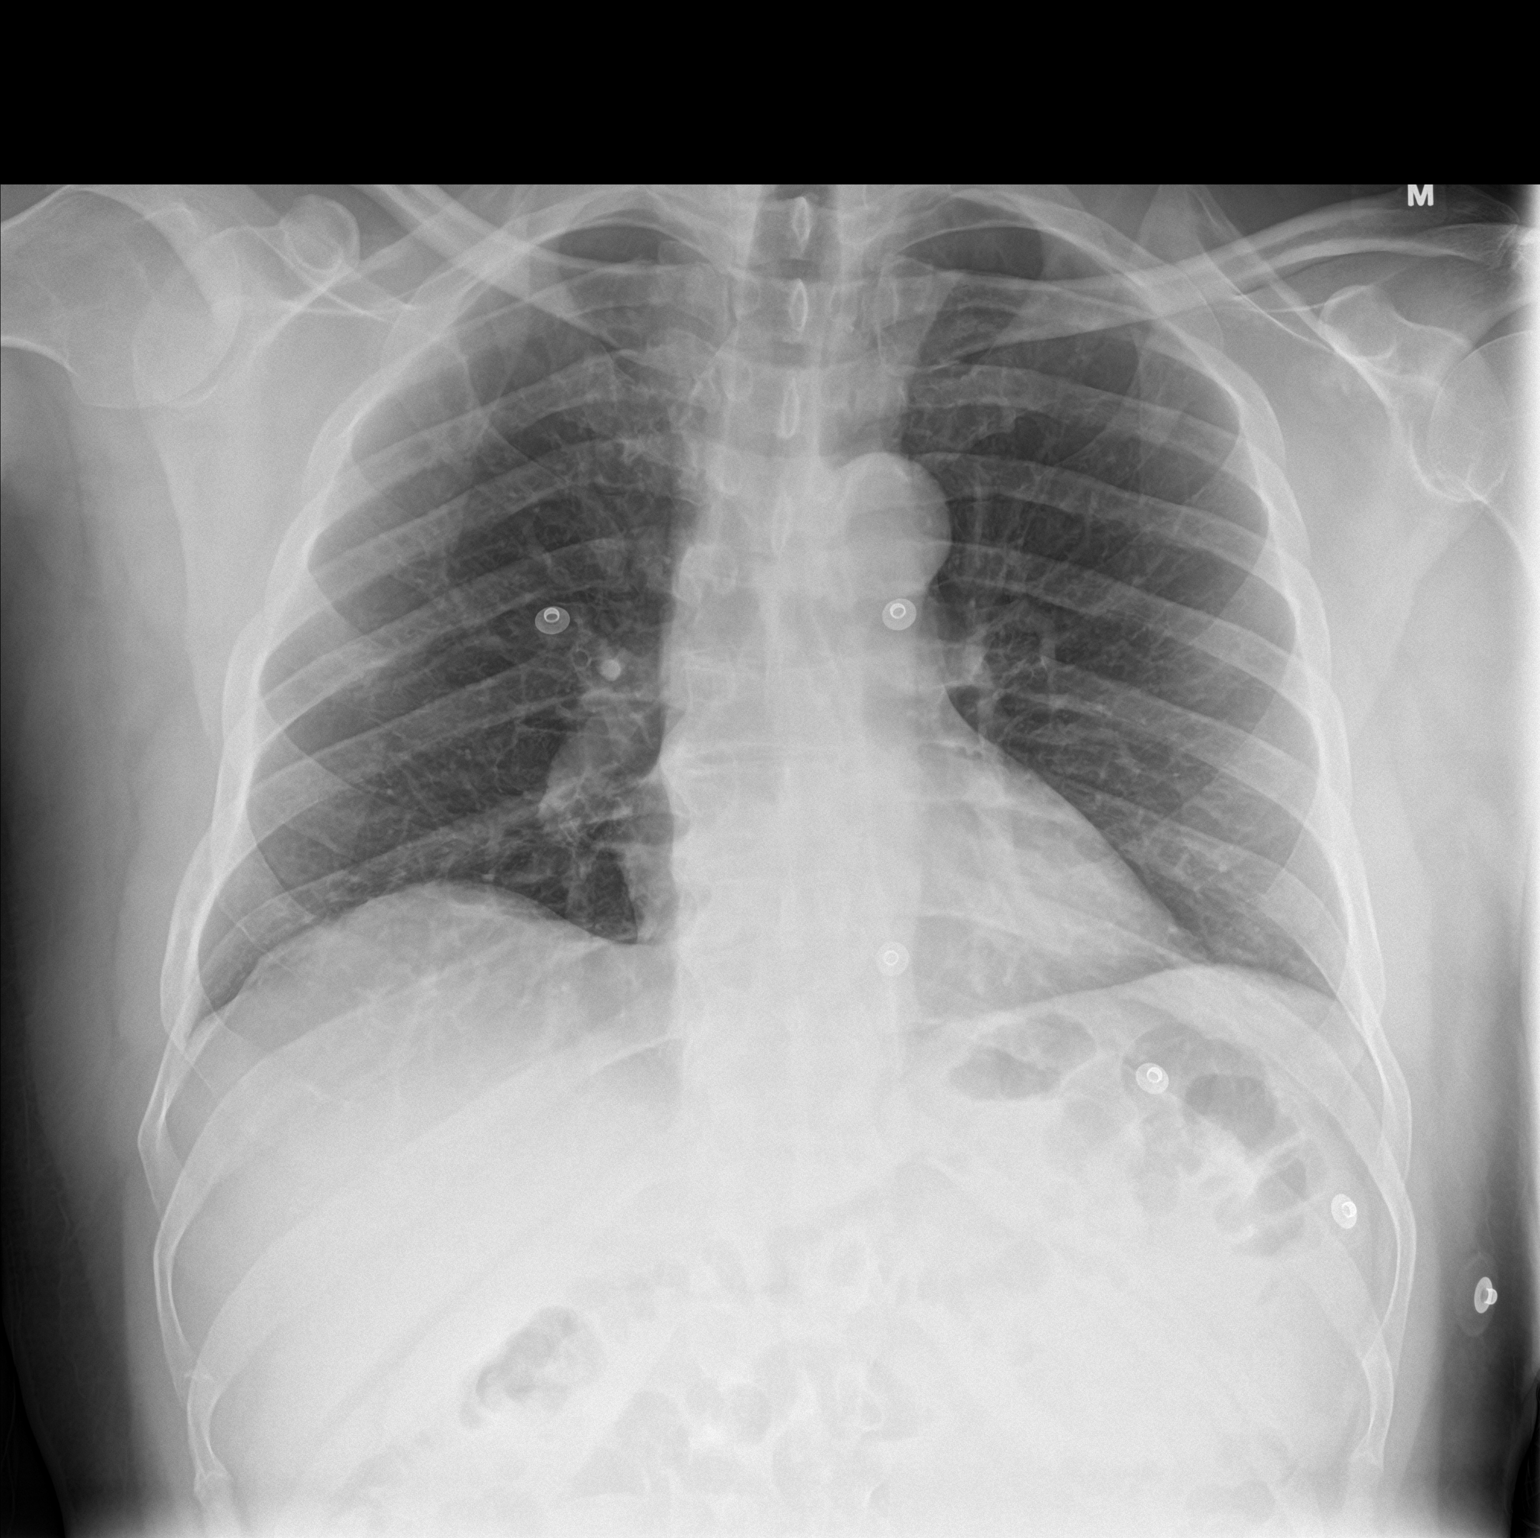

[chest lat]
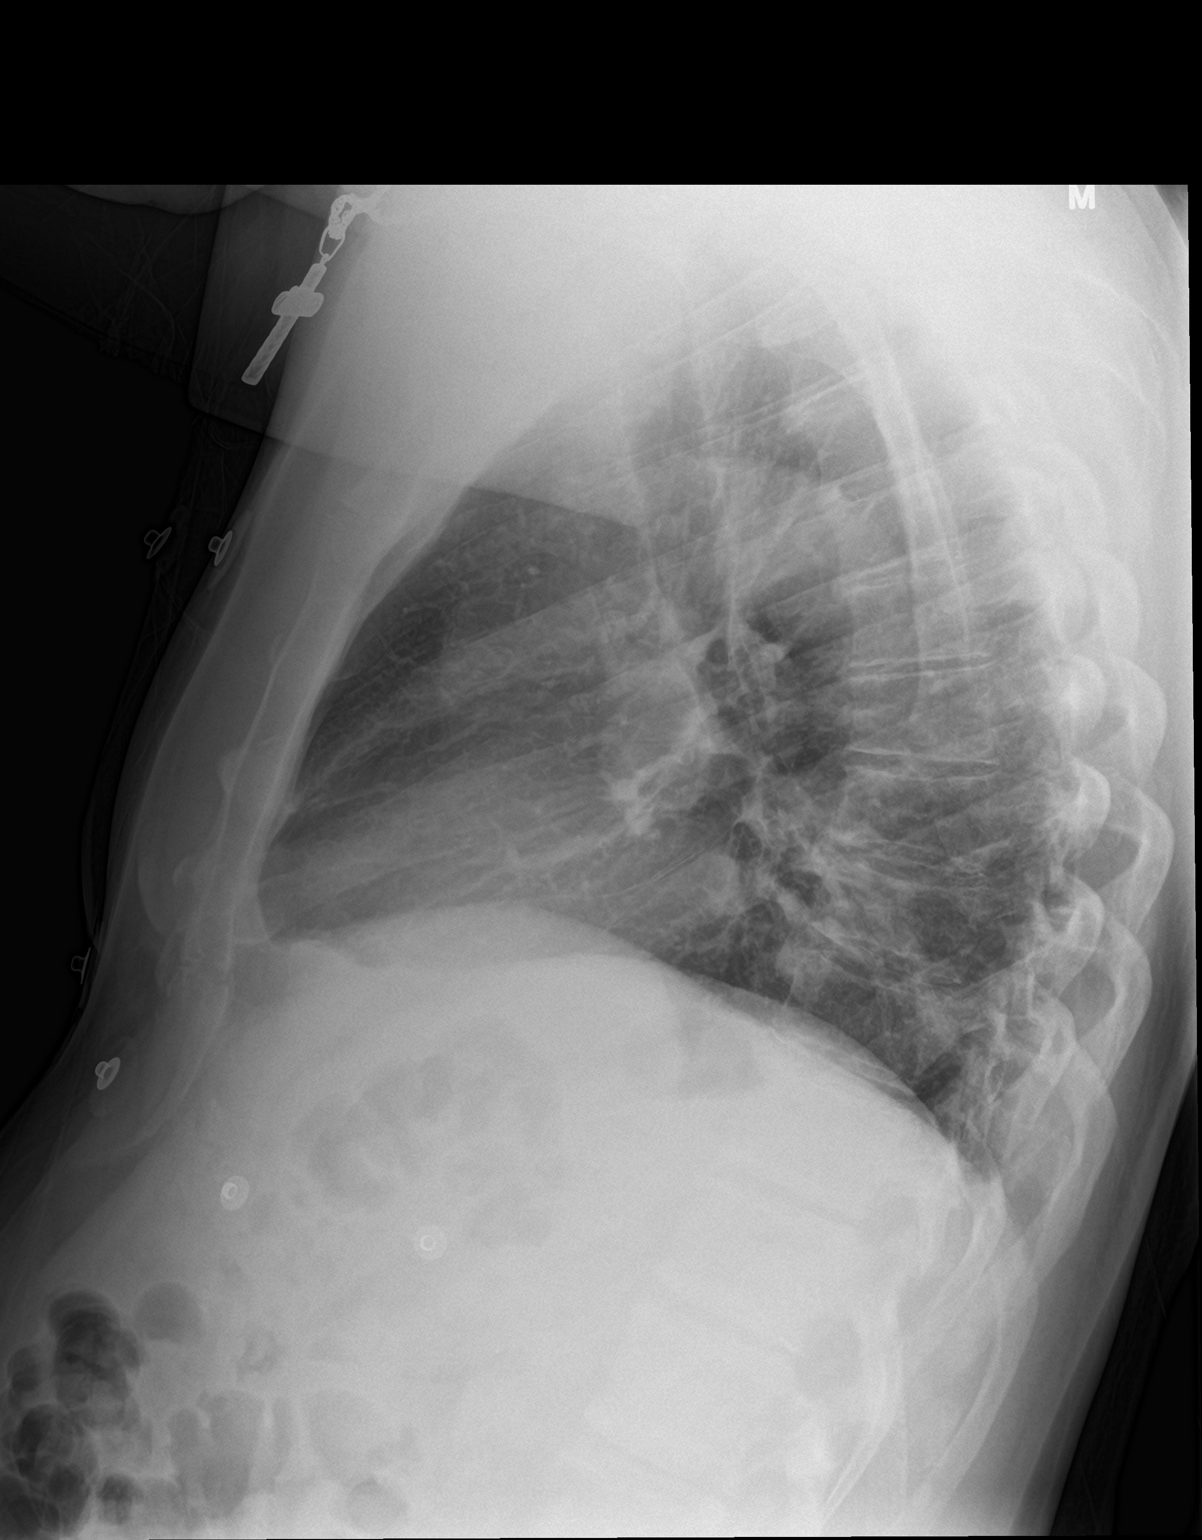

[2 of 2 positions shown; findings below may reference images not displayed]

FINDINGS: Mild atherosclerotic calcification of the aortic arch. Heart size
normal. The lungs appear clear. No pleural effusion.

Mild thoracic spondylosis.
IMPRESSION: 1. Mild atherosclerotic calcification of the aortic arch.
2. No acute findings.
3. Thoracic spondylosis.

## 2015-10-23 MED ORDER — LORAZEPAM 2 MG/ML IJ SOLN
0.5000 mg | Freq: Once | INTRAMUSCULAR | Status: AC
  Administered 2015-10-23: 0.5 mg via INTRAVENOUS
  Filled 2015-10-23: qty 1

## 2015-10-23 MED ORDER — HYDROMORPHONE HCL 1 MG/ML IJ SOLN
1.0000 mg | Freq: Once | INTRAMUSCULAR | Status: AC
  Administered 2015-10-23: 1 mg via INTRAVENOUS
  Filled 2015-10-23: qty 1

## 2015-10-23 MED ORDER — PANTOPRAZOLE SODIUM 20 MG PO TBEC: 20.0000 mg | DELAYED_RELEASE_TABLET | Freq: Every day | ORAL | Status: DC

## 2015-10-23 NOTE — Discharge Instructions (Signed)
Follow-up with your family doctor next week for recheck. 

## 2015-10-23 NOTE — ED Notes (Addendum)
PT reports to the ED for eval of epigastric pain that radiates to his left shoulder. Pt reports the pain started yesterday. Today he developed some associated SOB. Pt reports some nausea but denies any vomiting. 12 lead showed ST elevation in leads V2 and V3. Pt received 324 of ASA and 1 nitro PTA. No changes in pain with the nitro. Pt placed on 4 L of O2 via nasal cannula for comfort. Denies any aggravating or relieving factors. CBG 134 mg/dl and VSS en route. Pt hypertensive at 140/100s en route. Pt A&Ox4, resp e/u, and skin warm and diaphoretic

## 2015-10-23 NOTE — ED Notes (Signed)
Patient is resting comfortably. 

## 2015-10-23 NOTE — ED Notes (Signed)
Patient transported to X-ray 

## 2015-10-23 NOTE — ED Notes (Signed)
Pt returned from XR. Pt hooked back up to monitor. Co-worker at bedside.

## 2015-10-23 NOTE — ED Provider Notes (Signed)
CSN: [id number]     Arrival date & time 10/23/15  1148 History   First MD Initiated Contact with Patient 10/23/15 1149     Chief Complaint  Patient presents with  . Abdominal Pain     (Consider location/radiation/quality/duration/timing/severity/associated sxs/prior Treatment) Patient is a 55 y.o. male presenting with abdominal pain. The history is provided by the patient (Patient states that he was having some epigastric and periumbilical abdominal discomfort and also started out having some weakness in his left arm left leg and numbness).  Abdominal Pain Pain location:  Periumbilical Pain quality: aching   Pain radiates to:  Does not radiate Pain severity:  Moderate Onset quality:  Sudden Timing:  Constant Progression:  Unchanged Chronicity:  New Context: not alcohol use   Relieved by:  Nothing Worsened by:  Nothing tried Associated symptoms: no chest pain, no cough, no diarrhea, no fatigue and no hematuria     Past Medical History  Diagnosis Date  . Hyperlipidemia   . Femur fracture [facility](HCC)    Past Surgical History  Procedure Laterality Date  . Lasik      2004, then right redone 2008   Family History  Problem Relation Age of Onset  . Cancer Paternal Grandmother     lung cancer  . Diabetes Neg Hx   . Coronary artery disease Neg Hx    Social History  Substance Use Topics  . Smoking status: Never Smoker   . Smokeless tobacco: Never Used  . Alcohol Use: No    Review of Systems  Constitutional: Negative for appetite change and fatigue.  HENT: Negative for congestion, ear discharge and sinus pressure.   Eyes: Negative for discharge.  Respiratory: Negative for cough.   Cardiovascular: Negative for chest pain.  Gastrointestinal: Positive for abdominal pain. Negative for diarrhea.  Genitourinary: Negative for frequency and hematuria.  Musculoskeletal: Negative for back pain.  Skin: Negative for rash.  Neurological: Negative for seizures and headaches.   Psychiatric/Behavioral: Negative for hallucinations.      Allergies  Review of patient's allergies indicates no known allergies.  Home Medications   Prior to Admission medications   Medication Sig Start Date End Date Taking? Authorizing Provider  aspirin EC 81 MG tablet Take 324 mg by mouth once.   Yes Historical Provider, MD  aspirin-acetaminophen-caffeine (EXCEDRIN MIGRAINE) [phone] MG tablet Take 1 tablet by mouth every 6 (six) hours as needed for headache.   Yes Historical Provider, MD  nitroGLYCERIN (NITROSTAT) 0.4 MG SL tablet Place 0.4 mg under the tongue every 5 (five) minutes as needed for chest pain.   Yes Historical Provider, MD  Phenyleph-Doxylamine-DM-APAP (ALKA SELTZER PLUS PO) Take 1 Dose by mouth every 8 (eight) hours as needed.   Yes Historical Provider, MD  atorvastatin (LIPITOR) 40 MG tablet TAKE 1 TABLET BY MOUTH DAILY Patient not taking: Reported on 10/23/2015 12/08/12   Karie Schwalbeichard I Letvak, MD  pantoprazole (PROTONIX) 20 MG tablet Take 1 tablet (20 mg total) by mouth daily. 10/23/15   Bethann BerkshireJoseph Marston Mccadden, MD   BP 104/78 mmHg  Pulse 67  Temp(Src) 99.1 F (37.3 C) (Oral)  Resp 17  SpO2 100% Physical Exam  Constitutional: He is oriented to person, place, and time. He appears well-developed.  HENT:  Head: Normocephalic.  Eyes: Conjunctivae and EOM are normal. No scleral icterus.  Neck: Neck supple. No thyromegaly present.  Cardiovascular: Normal rate and regular rhythm.  Exam reveals no gallop and no friction rub.   No murmur heard. Pulmonary/Chest: No stridor. He has  no wheezes. He has no rales. He exhibits no tenderness.  Abdominal: He exhibits no distension. There is tenderness. There is no rebound.  Musculoskeletal:  Mild weakness in his left arm with numbness  Lymphadenopathy:    He has no cervical adenopathy.  Neurological: He is oriented to person, place, and time. He exhibits normal muscle tone. Coordination normal.  Skin: No rash noted. No erythema.   Psychiatric: He has a normal mood and affect. His behavior is normal.    ED Course  Procedures (including critical care time) Labs Review Labs Reviewed  CBC WITH DIFFERENTIAL/PLATELET - Abnormal; Notable for the following:    Monocytes Absolute 1.1 (*)    All other components within normal limits  COMPREHENSIVE METABOLIC PANEL - Abnormal; Notable for the following:    Potassium 3.3 (*)    Glucose, Bld 108 (*)    Creatinine, Ser 1.42 (*)    GFR calc non Af Amer 54 (*)    All other components within normal limits  I-STAT TROPOININ, ED    Imaging Review Dg Chest 2 View  10/23/2015  CLINICAL DATA:  Epigastric pain radiating to the left shoulder starting yesterday. Shortness of breath. EXAM: CHEST  2 VIEW COMPARISON:  03/25/2012 FINDINGS: Mild atherosclerotic calcification of the aortic arch. Heart size normal. The lungs appear clear. No pleural effusion. Mild thoracic spondylosis. IMPRESSION: 1. Mild atherosclerotic calcification of the aortic arch. 2. No acute findings. 3. Thoracic spondylosis. Electronically Signed   By: Gaylyn Rong M.D.   On: 10/23/2015 12:40   Mr Brain Wo Contrast  10/23/2015  CLINICAL DATA:  Pain.  Nausea.  Hypertension. EXAM: MRI HEAD WITHOUT CONTRAST TECHNIQUE: Multiplanar, multiecho pulse sequences of the brain and surrounding structures were obtained without intravenous contrast. COMPARISON:  CT head 03/25/2012 FINDINGS: Ventricle size normal.  Cerebral volume normal. Negative for acute infarct. Scattered hyperintensities in the subcortical and deep white matter bilaterally. Brainstem and basal ganglia normal. Cerebellum normal. Negative for hemorrhage or fluid collection. Negative for mass or edema.  No shift of the midline structures. Circle of Willis patent.  Normal orbit. Pituitary and skull base normal. IMPRESSION: No acute abnormality. Chronic microvascular ischemic changes in the white matter. Electronically Signed   By: Marlan Palau M.D.   On:  10/23/2015 14:22   Mr Cervical Spine Wo Contrast  10/23/2015  CLINICAL DATA:  Epigastric pain. Neck pain. Radiates to LEFT shoulder. EXAM: MRI CERVICAL SPINE WITHOUT CONTRAST TECHNIQUE: Multiplanar, multisequence MR imaging of the cervical spine was performed. No intravenous contrast was administered. COMPARISON:  None. FINDINGS: Alignment: Physiologic. Vertebrae: No fracture, evidence of discitis, or bone lesion. Cord: Normal signal and morphology. Posterior Fossa, vertebral arteries, paraspinal tissues: Negative. Disc levels: No disc protrusion or spinal stenosis. IMPRESSION: Negative exam. Electronically Signed   By: Elsie Stain M.D.   On: 10/23/2015 14:45   I have personally reviewed and evaluated these images and lab results as part of my medical decision-making.   EKG Interpretation   Date/Time:  Wednesday October 23 2015 11:54:03 EDT Ventricular Rate:  92 PR Interval:    QRS Duration: 74 QT Interval:  319 QTC Calculation: 395 R Axis:   34 Text Interpretation:  Sinus rhythm Borderline T abnormalities, inferior  leads Confirmed by Jametta Moorehead  MD, Stephany Poorman [phone]) on 10/23/2015 11:58:18 AM  Also confirmed by Jared Whorley  MD, Jomarie Longs [phone]), editor Whitney Post, Cala Bradford  [phone])  on 10/23/2015 12:19:18 PM      MDM   Final diagnoses:  Abdominal pain in male  Labs unremarkable MRI of head and neck unremarkable. Will treat abdominal discomfort with protonic for GERD. Patient's weakness and numbness in his left arm and left leg seem to be improving. With a normal MRI this could be related to stress. Patient will follow-up with his PCP next week    Bethann Berkshire, MD 10/23/15 1611

## 2015-10-29 ENCOUNTER — Telehealth: Payer: Self-pay

## 2015-10-29 NOTE — Telephone Encounter (Signed)
Left message on VM per DPR to see how he was doing after his ER visit 10-23-15 for Abdominal Pain

## 2015-11-01 ENCOUNTER — Ambulatory Visit (INDEPENDENT_AMBULATORY_CARE_PROVIDER_SITE_OTHER): Payer: BLUE CROSS/BLUE SHIELD | Admitting: Internal Medicine

## 2015-11-01 ENCOUNTER — Encounter: Payer: Self-pay | Admitting: Internal Medicine

## 2015-11-01 VITALS — BP 130/70 | HR 75 | Temp 97.5°F | Wt 212.0 lb

## 2015-11-01 DIAGNOSIS — R208 Other disturbances of skin sensation: Secondary | ICD-10-CM | POA: Diagnosis not present

## 2015-11-01 DIAGNOSIS — R200 Anesthesia of skin: Secondary | ICD-10-CM | POA: Insufficient documentation

## 2015-11-01 DIAGNOSIS — R109 Unspecified abdominal pain: Secondary | ICD-10-CM | POA: Insufficient documentation

## 2015-11-01 DIAGNOSIS — R1084 Generalized abdominal pain: Secondary | ICD-10-CM | POA: Diagnosis not present

## 2015-11-01 NOTE — Progress Notes (Signed)
Subjective:    Patient ID: Col[place], male    DOB: 1961-03-10, 55 y.o.   MRN: [id number]  HPI Here for ER follow up  Was sitting in a meeting--then started getting numbness and tingling and left fingers and then hand Then he felt it in leg Started feeling hot, stomach cramping, etc Went out of meeting to get cold rag and next thing he really knew was paramedics with him on floor  Still had the symptoms so to the ER Reviewed the ER records and MRIs Felt better in ER and went home Took 2 days off and rested and now back to his usual Still feels very tired Will lose train of thought at times  Still has ongoing difficult situation at home Wife still with delusional disorder-- and continues to accuse him of affair, eavesdropping on her, etc etc  Current Outpatient Prescriptions on File Prior to Visit  Medication Sig Dispense Refill  . aspirin EC 81 MG tablet Take 324 mg by mouth once.    Marland Kitchen. aspirin-acetaminophen-caffeine (EXCEDRIN MIGRAINE) 250-250-65 MG tablet Take 1 tablet by mouth every 6 (six) hours as needed for headache.    . pantoprazole (PROTONIX) 20 MG tablet Take 1 tablet (20 mg total) by mouth daily. 30 tablet 0  . atorvastatin (LIPITOR) 40 MG tablet TAKE 1 TABLET BY MOUTH DAILY (Patient not taking: Reported on 10/23/2015) 30 tablet 5  . nitroGLYCERIN (NITROSTAT) 0.4 MG SL tablet Place 0.4 mg under the tongue every 5 (five) minutes as needed for chest pain. Reported on 11/01/2015     No current facility-administered medications on file prior to visit.    No Known Allergies  Past Medical History  Diagnosis Date  . Hyperlipidemia   . Femur fracture [facility](HCC)     Past Surgical History  Procedure Laterality Date  . Lasik      2004, then right redone 2008    Family History  Problem Relation Age of Onset  . Cancer Paternal Grandmother     lung cancer  . Diabetes Neg Hx   . Coronary artery disease Neg Hx     Social History   Social History  . Marital Status:  Married    Spouse Name: N/A  . Number of Children: 1  . Years of Education: N/A   Occupational History  . Banker     [employer]   Social History Main Topics  . Smoking status: Never Smoker   . Smokeless tobacco: Never Used  . Alcohol Use: No  . Drug Use: Not on file  . Sexual Activity: Not on file   Other Topics Concern  . Not on file   Social History Narrative   Review of Systems  Not eating great--appetite not that good Has lost 20# Sleep is difficult---wife often up to 2-3AM yelling, etc     Objective:   Physical Exam  Constitutional: He appears well-developed and well-nourished. No distress.  Neck: Normal range of motion. Neck supple.  Cardiovascular: Normal rate, regular rhythm and normal heart sounds.  Exam reveals no gallop.   No murmur heard. Pulmonary/Chest: Effort normal and breath sounds normal. No respiratory distress. He has no wheezes. He has no rales.  Abdominal: Soft. He exhibits no distension and no mass. There is no rebound and no guarding.  Moderate tenderness both LQs  Musculoskeletal: He exhibits no tenderness.  Lymphadenopathy:    He has no cervical adenopathy.       Right: No inguinal adenopathy present.  Left: No inguinal adenopathy present.  Psychiatric:  Very stressed with his wife's illness          Assessment & Plan:

## 2015-11-01 NOTE — Assessment & Plan Note (Addendum)
Unclear situation Will check ultrasound for starters (creat up slightly so want to avoid contrast) Could be functional but has lost weight and appetite poor If ultrasound unrevealing--will set up with GI

## 2015-11-01 NOTE — Progress Notes (Signed)
Pre visit review using our clinic review tool, if applicable. No additional management support is needed unless otherwise documented below in the visit note. 

## 2015-11-01 NOTE — Assessment & Plan Note (Signed)
Probably related to peripheral nerve since MRIs normal

## 2015-11-06 ENCOUNTER — Other Ambulatory Visit: Payer: Self-pay | Admitting: Internal Medicine

## 2015-11-06 ENCOUNTER — Ambulatory Visit
Admission: RE | Admit: 2015-11-06 | Discharge: 2015-11-06 | Disposition: A | Discharge status: Home / Self Care | Payer: BLUE CROSS/BLUE SHIELD | Source: Ambulatory Visit | Attending: Internal Medicine | Admitting: Internal Medicine

## 2015-11-06 DIAGNOSIS — K760 Fatty (change of) liver, not elsewhere classified: Secondary | ICD-10-CM | POA: Diagnosis not present

## 2015-11-06 DIAGNOSIS — K7689 Other specified diseases of liver: Secondary | ICD-10-CM | POA: Insufficient documentation

## 2015-11-06 DIAGNOSIS — R1084 Generalized abdominal pain: Secondary | ICD-10-CM | POA: Diagnosis not present

## 2015-12-09 ENCOUNTER — Encounter: Payer: Self-pay | Admitting: Gastroenterology

## 2016-01-21 ENCOUNTER — Other Ambulatory Visit: Payer: Self-pay

## 2016-01-21 NOTE — Telephone Encounter (Signed)
Pt left v/m requesting rx oxycodone apap. Call when ready for pick up. Last printed # 60 on 12/27/14. Last seen 11/01/15.

## 2016-01-22 MED ORDER — OXYCODONE-ACETAMINOPHEN 5-325 MG PO TABS: 1.0000 | ORAL_TABLET | Freq: Two times a day (BID) | ORAL | 0 refills | Status: DC | PRN

## 2016-01-22 NOTE — Telephone Encounter (Signed)
Spoke to pt. RX up front 

## 2016-01-23 ENCOUNTER — Encounter: Payer: BLUE CROSS/BLUE SHIELD | Admitting: Internal Medicine

## 2016-01-23 DIAGNOSIS — Z0289 Encounter for other administrative examinations: Secondary | ICD-10-CM

## 2016-02-12 ENCOUNTER — Ambulatory Visit: Payer: BLUE CROSS/BLUE SHIELD | Admitting: Gastroenterology

## 2016-04-21 ENCOUNTER — Other Ambulatory Visit: Payer: Self-pay

## 2016-04-21 NOTE — Telephone Encounter (Signed)
Pt left v/m requesting rx oxycodone apap. Call when ready for pick up. Last printed # 60 on 01/22/16; last seen 11/01/15.

## 2016-04-22 MED ORDER — OXYCODONE-ACETAMINOPHEN 5-325 MG PO TABS: 1.0000 | ORAL_TABLET | Freq: Two times a day (BID) | ORAL | 0 refills | Status: DC | PRN

## 2016-04-22 NOTE — Telephone Encounter (Signed)
Spoke to pt. Rx up front ready for pickup 

## 2016-08-19 ENCOUNTER — Other Ambulatory Visit: Payer: Self-pay

## 2016-08-19 NOTE — Telephone Encounter (Signed)
Pt left v/m requesting rx oxycodone apap. Call when ready for pick up. Last printed # 60 on 04/22/16 and last seen annual 09/19/2013 and last ED f/u 11/01/15.Please advise.

## 2016-08-20 ENCOUNTER — Other Ambulatory Visit: Payer: BLUE CROSS/BLUE SHIELD

## 2016-08-20 ENCOUNTER — Encounter: Payer: Self-pay | Admitting: Internal Medicine

## 2016-08-20 DIAGNOSIS — Z0283 Encounter for blood-alcohol and blood-drug test: Secondary | ICD-10-CM

## 2016-08-20 MED ORDER — OXYCODONE-ACETAMINOPHEN 5-325 MG PO TABS: 1.0000 | ORAL_TABLET | Freq: Two times a day (BID) | ORAL | 0 refills | Status: DC | PRN

## 2016-08-20 NOTE — Telephone Encounter (Signed)
Spoke to pt and informed him Rx is available for pickup from the front desk. Pt advised third party unable to pickup .  

## 2016-08-27 LAB — TOXASSURE SELECT 13 (MW), URINE

## 2017-01-11 ENCOUNTER — Telehealth: Payer: Self-pay | Admitting: Internal Medicine

## 2017-01-11 ENCOUNTER — Encounter: Payer: BLUE CROSS/BLUE SHIELD | Admitting: Internal Medicine

## 2017-01-11 DIAGNOSIS — Z0289 Encounter for other administrative examinations: Secondary | ICD-10-CM

## 2017-01-11 NOTE — Telephone Encounter (Signed)
Please call him to schedule at his earliest convenience.  Find out what happened but I prefer not to charge him

## 2017-01-11 NOTE — Telephone Encounter (Signed)
Patient did not come in for their appointment today for cpe. Please let me know if patient needs to be contacted immediately for follow up or no follow up needed. Do you want to charge the NSF? °

## 2017-01-13 NOTE — Telephone Encounter (Signed)
Left message to reschedule appt

## 2017-01-25 ENCOUNTER — Encounter: Payer: Self-pay | Admitting: Internal Medicine

## 2017-01-25 NOTE — Telephone Encounter (Signed)
Sent letter to reschedule appt.

## 2017-03-12 ENCOUNTER — Telehealth: Payer: Self-pay | Admitting: Internal Medicine

## 2017-03-12 NOTE — Telephone Encounter (Signed)
Patient requesting refill on pain medication / LOV 11/01/2015 / Advised patient that an office visit is required since he missed his appointment in September  2018/  Patient notifed, appointment rescheduled. /

## 2017-03-12 NOTE — Telephone Encounter (Signed)
Copied from CRM [id number]. Topic: Quick Communication - See Telephone Encounter >> Mar 12, 2017  9:56 AM Guinevere FerrariMorris, Krysta Bloomfield E, NT wrote: CRM for notification. See Telephone encounter for: Pt. Is calling for a refill for oxyCODONE-acetaminophen (PERCOCET/ROXICET) 5-325 MG tablet. Pt. Would like to pick up prescription.  03/12/17.

## 2017-03-16 ENCOUNTER — Encounter: Payer: BLUE CROSS/BLUE SHIELD | Admitting: Internal Medicine

## 2017-03-17 ENCOUNTER — Other Ambulatory Visit: Payer: Self-pay | Admitting: Internal Medicine

## 2017-03-17 ENCOUNTER — Other Ambulatory Visit (INDEPENDENT_AMBULATORY_CARE_PROVIDER_SITE_OTHER): Payer: BLUE CROSS/BLUE SHIELD

## 2017-03-17 DIAGNOSIS — Z125 Encounter for screening for malignant neoplasm of prostate: Secondary | ICD-10-CM

## 2017-03-17 DIAGNOSIS — E785 Hyperlipidemia, unspecified: Secondary | ICD-10-CM | POA: Diagnosis not present

## 2017-03-17 LAB — CBC
HCT: 44.7 % (ref 39.0–52.0)
HEMOGLOBIN: 14.7 g/dL (ref 13.0–17.0)
MCHC: 32.9 g/dL (ref 30.0–36.0)
MCV: 88.2 fl (ref 78.0–100.0)
Platelets: 212 10*3/uL (ref 150.0–400.0)
RBC: 5.07 Mil/uL (ref 4.22–5.81)
RDW: 14.8 % (ref 11.5–15.5)
WBC: 4.5 10*3/uL (ref 4.0–10.5)

## 2017-03-17 LAB — LIPID PANEL
CHOLESTEROL: 317 mg/dL — AB (ref 0–200)
HDL: 50.3 mg/dL (ref >39.00)
NonHDL: 266.74
TRIGLYCERIDES: 229 mg/dL — AB (ref 0.0–149.0)
Total CHOL/HDL Ratio: 6
VLDL: 45.8 mg/dL — AB (ref 0.0–40.0)

## 2017-03-17 LAB — COMPREHENSIVE METABOLIC PANEL
ALBUMIN: 4.2 g/dL (ref 3.5–5.2)
ALK PHOS: 57 U/L (ref 39–117)
ALT: 27 U/L (ref 0–53)
AST: 23 U/L (ref 0–37)
BUN: 18 mg/dL (ref 6–23)
CALCIUM: 9.7 mg/dL (ref 8.4–10.5)
CHLORIDE: 105 meq/L (ref 96–112)
CO2: 30 mEq/L (ref 19–32)
Creatinine, Ser: 1.36 mg/dL (ref 0.40–1.50)
GFR: 69.24 mL/min (ref >60.00)
Glucose, Bld: 101 mg/dL — ABNORMAL HIGH (ref 70–99)
POTASSIUM: 4.6 meq/L (ref 3.5–5.1)
SODIUM: 142 meq/L (ref 135–145)
TOTAL PROTEIN: 7.2 g/dL (ref 6.0–8.3)
Total Bilirubin: 0.7 mg/dL (ref 0.2–1.2)

## 2017-03-17 LAB — PSA: PSA: 4.89 ng/mL — ABNORMAL HIGH (ref 0.10–4.00)

## 2017-03-17 LAB — LDL CHOLESTEROL, DIRECT: LDL DIRECT: 204 mg/dL

## 2017-03-18 ENCOUNTER — Ambulatory Visit (INDEPENDENT_AMBULATORY_CARE_PROVIDER_SITE_OTHER): Payer: BLUE CROSS/BLUE SHIELD | Admitting: Internal Medicine

## 2017-03-18 ENCOUNTER — Encounter: Payer: Self-pay | Admitting: Internal Medicine

## 2017-03-18 VITALS — BP 130/86 | HR 72 | Temp 98.3°F | Ht 69.0 in | Wt 220.0 lb

## 2017-03-18 DIAGNOSIS — Z0000 Encounter for general adult medical examination without abnormal findings: Secondary | ICD-10-CM

## 2017-03-18 DIAGNOSIS — Z1211 Encounter for screening for malignant neoplasm of colon: Secondary | ICD-10-CM | POA: Diagnosis not present

## 2017-03-18 DIAGNOSIS — M545 Low back pain, unspecified: Secondary | ICD-10-CM | POA: Insufficient documentation

## 2017-03-18 DIAGNOSIS — E785 Hyperlipidemia, unspecified: Secondary | ICD-10-CM | POA: Diagnosis not present

## 2017-03-18 DIAGNOSIS — R972 Elevated prostate specific antigen [PSA]: Secondary | ICD-10-CM | POA: Diagnosis not present

## 2017-03-18 NOTE — Progress Notes (Signed)
Subjective:    Patient ID: Colton Miller, male    DOB: Oct 16, 1960, 56 y.o.   MRN: [id number]  HPI Here for physical  Has intermittent back pain Uses the oxycodone once in while Reviewed CSRS--only 3 Rx in past 2 years Had brief bad knee pain--but then it stops (seemed to get worse when he used joint supplement that his wife had  Gets scary moments when he yawns--gets spasm in his neck--he has to put pressure on his SCM Clearly just seems like spasm No trouble with his teeth--keeps up with dentist  Some troubles with voiding Delay and urgency. Stream is fine Using saw palmetto with some success  Discussed cholesterol He has been using air fryer more No exercise No FH of CAD Ready to go back on the atorvastatin--no problems with this  Current Outpatient Medications on File Prior to Visit  Medication Sig Dispense Refill  . aspirin EC 81 MG tablet Take 324 mg by mouth once.    Marland Kitchen. aspirin-acetaminophen-caffeine (EXCEDRIN MIGRAINE) 250-250-65 MG tablet Take 1 tablet by mouth every 6 (six) hours as needed for headache.    . oxyCODONE-acetaminophen (PERCOCET/ROXICET) 5-325 MG tablet Take 1 tablet by mouth 2 (two) times daily as needed. 60 tablet 0   No current facility-administered medications on file prior to visit.     No Known Allergies  Past Medical History:  Diagnosis Date  . Femur fracture (HCC)   . Hyperlipidemia     Past Surgical History:  Procedure Laterality Date  . LASIK     2004, then right redone 2008    Family History  Problem Relation Age of Onset  . Cancer Paternal Grandmother        lung cancer  . Stroke Mother   . Heart disease Father   . Cancer Paternal Aunt   . Diabetes Neg Hx   . Coronary artery disease Neg Hx     Social History   Socioeconomic History  . Marital status: Married    Spouse name: Not on file  . Number of children: 1  . Years of education: Not on file  . Highest education level: Not on file  Social Needs  . Financial  resource strain: Not on file  . Food insecurity - worry: Not on file  . Food insecurity - inability: Not on file  . Transportation needs - medical: Not on file  . Transportation needs - non-medical: Not on file  Occupational History  . Occupation: Psychologist, occupationalBanker    Comment: [employer]  Tobacco Use  . Smoking status: Never Smoker  . Smokeless tobacco: Never Used  Substance and Sexual Activity  . Alcohol use: No  . Drug use: Not on file  . Sexual activity: Not on file  Other Topics Concern  . Not on file  Social History Narrative  . Not on file   Review of Systems  Constitutional: Negative for fatigue.       Weight is up a few pounds Wears seat belt  HENT: Negative for dental problem, hearing loss, tinnitus and trouble swallowing.        Keeps up with dentist  Eyes: Negative for visual disturbance.       No diplopia or unilateral vision loss Occasional blurred vision  Respiratory: Negative for cough, chest tightness and shortness of breath.        Occasional tightness under left ribs---relaxes  Cardiovascular: Negative for chest pain, palpitations and leg swelling.  Gastrointestinal: Negative for blood in stool and  constipation.       No heartburn   Endocrine: Negative for polydipsia and polyuria.  Genitourinary: Positive for urgency. Negative for dysuria.       No sexual problems  Musculoskeletal: Positive for neck pain. Negative for joint swelling.  Skin: Negative for rash.       No suspicious lesions  Allergic/Immunologic: Negative for environmental allergies and immunocompromised state.  Neurological: Negative for dizziness, syncope and light-headedness.       Occ stress headaches  Hematological: Negative for adenopathy. Does not bruise/bleed easily.  Psychiatric/Behavioral: Negative for dysphoric mood and sleep disturbance. The patient is not nervous/anxious.        Objective:   Physical Exam  Constitutional: He is oriented to person, place, and time. He appears  well-nourished. No distress.  HENT:  Head: Normocephalic and atraumatic.  Right Ear: External ear normal.  Left Ear: External ear normal.  Mouth/Throat: Oropharynx is clear and moist. No oropharyngeal exudate.  Eyes: Conjunctivae are normal. Pupils are equal, round, and reactive to light.  Neck: No thyromegaly present.  Cardiovascular: Normal rate, regular rhythm, normal heart sounds and intact distal pulses. Exam reveals no gallop.  No murmur heard. Pulmonary/Chest: Effort normal and breath sounds normal. No respiratory distress. He has no wheezes. He has no rales.  Some sternal tenderness  Abdominal: Soft. He exhibits no distension. There is no tenderness. There is no rebound and no guarding.  Musculoskeletal: He exhibits no edema or tenderness.  Lymphadenopathy:    He has no cervical adenopathy.  Neurological: He is alert and oriented to person, place, and time.  Skin: No rash noted. No erythema.  Psychiatric: He has a normal mood and affect. His behavior is normal.          Assessment & Plan:

## 2017-03-18 NOTE — Assessment & Plan Note (Signed)
Has BPH symptoms Likely benign Will recheck in 3 months

## 2017-03-18 NOTE — Assessment & Plan Note (Signed)
High Will restart atorvastatin

## 2017-03-18 NOTE — Patient Instructions (Signed)
DASH Eating Plan DASH stands for "Dietary Approaches to Stop Hypertension." The DASH eating plan is a healthy eating plan that has been shown to reduce high blood pressure (hypertension). It may also reduce your risk for type 2 diabetes, heart disease, and stroke. The DASH eating plan may also help with weight loss. What are tips for following this plan? General guidelines  Avoid eating more than 2,300 mg (milligrams) of salt (sodium) a day. If you have hypertension, you may need to reduce your sodium intake to 1,500 mg a day.  Limit alcohol intake to no more than 1 drink a day for nonpregnant women and 2 drinks a day for men. One drink equals 12 oz of beer, 5 oz of wine, or 1 oz of hard liquor.  Work with your health care provider to maintain a healthy body weight or to lose weight. Ask what an ideal weight is for you.  Get at least 30 minutes of exercise that causes your heart to beat faster (aerobic exercise) most days of the week. Activities may include walking, swimming, or biking.  Work with your health care provider or diet and nutrition specialist (dietitian) to adjust your eating plan to your individual calorie needs. Reading food labels  Check food labels for the amount of sodium per serving. Choose foods with less than 5 percent of the Daily Value of sodium. Generally, foods with less than 300 mg of sodium per serving fit into this eating plan.  To find whole grains, look for the word "whole" as the first word in the ingredient list. Shopping  Buy products labeled as "low-sodium" or "no salt added."  Buy fresh foods. Avoid canned foods and premade or frozen meals. Cooking  Avoid adding salt when cooking. Use salt-free seasonings or herbs instead of table salt or sea salt. Check with your health care provider or pharmacist before using salt substitutes.  Do not fry foods. Cook foods using healthy methods such as baking, boiling, grilling, and broiling instead.  Cook with  heart-healthy oils, such as olive, canola, soybean, or sunflower oil. Meal planning   Eat a balanced diet that includes: ? 5 or more servings of fruits and vegetables each day. At each meal, try to fill half of your plate with fruits and vegetables. ? Up to 6-8 servings of whole grains each day. ? Less than 6 oz of lean meat, poultry, or fish each day. A 3-oz serving of meat is about the same size as a deck of cards. One egg equals 1 oz. ? 2 servings of low-fat dairy each day. ? A serving of nuts, seeds, or beans 5 times each week. ? Heart-healthy fats. Healthy fats called Omega-3 fatty acids are found in foods such as flaxseeds and coldwater fish, like sardines, salmon, and mackerel.  Limit how much you eat of the following: ? Canned or prepackaged foods. ? Food that is high in trans fat, such as fried foods. ? Food that is high in saturated fat, such as fatty meat. ? Sweets, desserts, sugary drinks, and other foods with added sugar. ? Full-fat dairy products.  Do not salt foods before eating.  Try to eat at least 2 vegetarian meals each week.  Eat more home-cooked food and less restaurant, buffet, and fast food.  When eating at a restaurant, ask that your food be prepared with less salt or no salt, if possible. What foods are recommended? The items listed may not be a complete list. Talk with your dietitian about what   dietary choices are best for you. Grains Whole-grain or whole-wheat bread. Whole-grain or whole-wheat pasta. Brown rice. Oatmeal. Quinoa. Bulgur. Whole-grain and low-sodium cereals. Pita bread. Low-fat, low-sodium crackers. Whole-wheat flour tortillas. Vegetables Fresh or frozen vegetables (raw, steamed, roasted, or grilled). Low-sodium or reduced-sodium tomato and vegetable juice. Low-sodium or reduced-sodium tomato sauce and tomato paste. Low-sodium or reduced-sodium canned vegetables. Fruits All fresh, dried, or frozen fruit. Canned fruit in natural juice (without  added sugar). Meat and other protein foods Skinless chicken or turkey. Ground chicken or turkey. Pork with fat trimmed off. Fish and seafood. Egg whites. Dried beans, peas, or lentils. Unsalted nuts, nut butters, and seeds. Unsalted canned beans. Lean cuts of beef with fat trimmed off. Low-sodium, lean deli meat. Dairy Low-fat (1%) or fat-free (skim) milk. Fat-free, low-fat, or reduced-fat cheeses. Nonfat, low-sodium ricotta or cottage cheese. Low-fat or nonfat yogurt. Low-fat, low-sodium cheese. Fats and oils Soft margarine without trans fats. Vegetable oil. Low-fat, reduced-fat, or light mayonnaise and salad dressings (reduced-sodium). Canola, safflower, olive, soybean, and sunflower oils. Avocado. Seasoning and other foods Herbs. Spices. Seasoning mixes without salt. Unsalted popcorn and pretzels. Fat-free sweets. What foods are not recommended? The items listed may not be a complete list. Talk with your dietitian about what dietary choices are best for you. Grains Baked goods made with fat, such as croissants, muffins, or some breads. Dry pasta or rice meal packs. Vegetables Creamed or fried vegetables. Vegetables in a cheese sauce. Regular canned vegetables (not low-sodium or reduced-sodium). Regular canned tomato sauce and paste (not low-sodium or reduced-sodium). Regular tomato and vegetable juice (not low-sodium or reduced-sodium). Pickles. Olives. Fruits Canned fruit in a light or heavy syrup. Fried fruit. Fruit in cream or butter sauce. Meat and other protein foods Fatty cuts of meat. Ribs. Fried meat. Bacon. Sausage. Bologna and other processed lunch meats. Salami. Fatback. Hotdogs. Bratwurst. Salted nuts and seeds. Canned beans with added salt. Canned or smoked fish. Whole eggs or egg yolks. Chicken or turkey with skin. Dairy Whole or 2% milk, cream, and half-and-half. Whole or full-fat cream cheese. Whole-fat or sweetened yogurt. Full-fat cheese. Nondairy creamers. Whipped toppings.  Processed cheese and cheese spreads. Fats and oils Butter. Stick margarine. Lard. Shortening. Ghee. Bacon fat. Tropical oils, such as coconut, palm kernel, or palm oil. Seasoning and other foods Salted popcorn and pretzels. Onion salt, garlic salt, seasoned salt, table salt, and sea salt. Worcestershire sauce. Tartar sauce. Barbecue sauce. Teriyaki sauce. Soy sauce, including reduced-sodium. Steak sauce. Canned and packaged gravies. Fish sauce. Oyster sauce. Cocktail sauce. Horseradish that you find on the shelf. Ketchup. Mustard. Meat flavorings and tenderizers. Bouillon cubes. Hot sauce and Tabasco sauce. Premade or packaged marinades. Premade or packaged taco seasonings. Relishes. Regular salad dressings. Where to find more information:  National Heart, Lung, and Blood Institute: www.nhlbi.nih.gov  American Heart Association: www.heart.org Summary  The DASH eating plan is a healthy eating plan that has been shown to reduce high blood pressure (hypertension). It may also reduce your risk for type 2 diabetes, heart disease, and stroke.  With the DASH eating plan, you should limit salt (sodium) intake to 2,300 mg a day. If you have hypertension, you may need to reduce your sodium intake to 1,500 mg a day.  When on the DASH eating plan, aim to eat more fresh fruits and vegetables, whole grains, lean proteins, low-fat dairy, and heart-healthy fats.  Work with your health care provider or diet and nutrition specialist (dietitian) to adjust your eating plan to your individual   calorie needs. This information is not intended to replace advice given to you by your health care provider. Make sure you discuss any questions you have with your health care provider. Document Released: 04/09/2011 Document Revised: 04/13/2016 Document Reviewed: 04/13/2016 Elsevier Interactive Patient Education  2017 Elsevier Inc.  

## 2017-03-18 NOTE — Assessment & Plan Note (Signed)
Intermittent Uses the oxycodone rarely

## 2017-03-18 NOTE — Assessment & Plan Note (Signed)
Healthy but needs to work on fitness again Will set up colonoscopy Doesn't want flu vaccine Td booster next year

## 2017-03-19 ENCOUNTER — Other Ambulatory Visit: Payer: Self-pay

## 2017-03-19 MED ORDER — OXYCODONE-ACETAMINOPHEN 5-325 MG PO TABS: 1.0000 | ORAL_TABLET | Freq: Two times a day (BID) | ORAL | 0 refills | Status: DC | PRN

## 2017-03-19 NOTE — Telephone Encounter (Signed)
Pt requesting rx oxycodone apap 5-325 mg . Pt seen annual 03/18/17.last UDS 08/20/16; last printed # 60 on 08/20/16. Pt is going out of town for the weekend thru the holiday and pt thought rx would be ready for pick up today; Dr Alphonsus SiasLetvak out of office this afternoon.Please advise.Pt request cb.THANK YOU.

## 2017-03-19 NOTE — Telephone Encounter (Signed)
Printed.  Thanks.  

## 2017-03-19 NOTE — Telephone Encounter (Signed)
I called pt to let know rx at front desk for pick up. Pt voiced understanding and on his way to pick up rx now.

## 2017-04-19 ENCOUNTER — Encounter: Payer: Self-pay | Admitting: Internal Medicine

## 2017-05-31 ENCOUNTER — Other Ambulatory Visit: Payer: Self-pay | Admitting: Internal Medicine

## 2017-05-31 DIAGNOSIS — Z1211 Encounter for screening for malignant neoplasm of colon: Secondary | ICD-10-CM

## 2017-06-07 ENCOUNTER — Telehealth: Payer: Self-pay

## 2017-06-07 NOTE — Telephone Encounter (Signed)
Gastroenterology Pre-Procedure Review  Request Date:   Requesting Physician: Dr.    PATIENT REVIEW QUESTIONS: The patient responded to the following health history questions as indicated:    1. Are you having any GI issues? No  2. Do you have a personal history of Polyps? No  3. Do you have a family history of Colon Cancer or Polyps? No  4. Diabetes Mellitus? No  5. Joint replacements in the past 12 months? No  6. Major health problems in the past 3 months? No  7. Any artificial heart valves, MVP, or defibrillator? No     MEDICATIONS & ALLERGIES:    Patient reports the following regarding taking any anticoagulation/antiplatelet therapy:   Plavix, Coumadin, Eliquis, Xarelto, Lovenox, Pradaxa, Brilinta, or Effient? No  Aspirin? No   Patient confirms/reports the following medications:  Current Outpatient Medications  Medication Sig Dispense Refill  . aspirin-acetaminophen-caffeine (EXCEDRIN MIGRAINE) 250-250-65 MG tablet Take 1 tablet by mouth every 6 (six) hours as needed for headache.    . oxyCODONE-acetaminophen (PERCOCET/ROXICET) 5-325 MG tablet Take 1 tablet 2 (two) times daily as needed by mouth. 60 tablet 0   No current facility-administered medications for this visit.     Patient confirms/reports the following allergies:  No Known Allergies  No orders of the defined types were placed in this encounter.   AUTHORIZATION INFORMATION Primary Insurance: 1D#: Group #:  Secondary Insurance: 1D#: Group #:  SCHEDULE INFORMATION: Date: 06/17/17 Time: Location: [facility]

## 2017-06-08 ENCOUNTER — Other Ambulatory Visit: Payer: Self-pay

## 2017-06-08 DIAGNOSIS — Z1211 Encounter for screening for malignant neoplasm of colon: Secondary | ICD-10-CM

## 2017-06-15 ENCOUNTER — Other Ambulatory Visit: Payer: Self-pay

## 2017-06-15 MED ORDER — NA SULFATE-K SULFATE-MG SULF 17.5-3.13-1.6 GM/177ML PO SOLN: 1.0000 | Freq: Once | ORAL | 0 refills | Status: AC

## 2017-06-17 ENCOUNTER — Ambulatory Visit
Admission: RE | Admit: 2017-06-17 | Discharge: 2017-06-17 | Disposition: A | Discharge status: Home / Self Care | Payer: BLUE CROSS/BLUE SHIELD | Source: Ambulatory Visit | Attending: Gastroenterology | Admitting: Gastroenterology

## 2017-06-17 ENCOUNTER — Ambulatory Visit: Payer: BLUE CROSS/BLUE SHIELD | Admitting: Anesthesiology

## 2017-06-17 ENCOUNTER — Encounter: Payer: Self-pay | Admitting: Anesthesiology

## 2017-06-17 ENCOUNTER — Encounter
Admission: RE | Disposition: A | Discharge status: Home / Self Care | Payer: Self-pay | Source: Ambulatory Visit | Attending: Gastroenterology

## 2017-06-17 DIAGNOSIS — K640 First degree hemorrhoids: Secondary | ICD-10-CM | POA: Insufficient documentation

## 2017-06-17 DIAGNOSIS — Z1211 Encounter for screening for malignant neoplasm of colon: Secondary | ICD-10-CM | POA: Diagnosis not present

## 2017-06-17 DIAGNOSIS — E785 Hyperlipidemia, unspecified: Secondary | ICD-10-CM | POA: Diagnosis not present

## 2017-06-17 DIAGNOSIS — K5730 Diverticulosis of large intestine without perforation or abscess without bleeding: Secondary | ICD-10-CM | POA: Diagnosis not present

## 2017-06-17 DIAGNOSIS — K649 Unspecified hemorrhoids: Secondary | ICD-10-CM | POA: Diagnosis not present

## 2017-06-17 DIAGNOSIS — K5790 Diverticulosis of intestine, part unspecified, without perforation or abscess without bleeding: Secondary | ICD-10-CM | POA: Diagnosis not present

## 2017-06-17 HISTORY — PX: COLONOSCOPY WITH PROPOFOL: SHX5780

## 2017-06-17 SURGERY — COLONOSCOPY WITH PROPOFOL
Anesthesia: General

## 2017-06-17 MED ORDER — PROPOFOL 500 MG/50ML IV EMUL
INTRAVENOUS | Status: AC
  Filled 2017-06-17: qty 50

## 2017-06-17 MED ORDER — LIDOCAINE HCL (PF) 2 % IJ SOLN
INTRAMUSCULAR | Status: AC
  Filled 2017-06-17: qty 10

## 2017-06-17 MED ORDER — SODIUM CHLORIDE 0.9 % IV SOLN
INTRAVENOUS | Status: DC
  Administered 2017-06-17: 1000 mL via INTRAVENOUS

## 2017-06-17 MED ORDER — PROPOFOL 10 MG/ML IV BOLUS
INTRAVENOUS | Status: DC | PRN
  Administered 2017-06-17: 100 mg via INTRAVENOUS

## 2017-06-17 MED ORDER — PROPOFOL 500 MG/50ML IV EMUL
INTRAVENOUS | Status: DC | PRN
  Administered 2017-06-17: 150 ug/kg/min via INTRAVENOUS

## 2017-06-17 MED ORDER — LIDOCAINE HCL (CARDIAC) 20 MG/ML IV SOLN
INTRAVENOUS | Status: DC | PRN
  Administered 2017-06-17: 100 mg via INTRAVENOUS

## 2017-06-17 NOTE — Anesthesia Preprocedure Evaluation (Signed)
Anesthesia Evaluation  Patient identified by MRN, date of birth, ID band Patient awake    Reviewed: Allergy & Precautions, H&P , NPO status , Patient's Chart, lab work & pertinent test results, reviewed documented beta blocker date and time   History of Anesthesia Complications Negative for: history of anesthetic complications  Airway Mallampati: I  TM Distance: >3 FB Neck ROM: full    Dental  (+) Dental Advidsory Given   Pulmonary neg shortness of breath, neg sleep apnea, neg COPD, Recent URI ,           Cardiovascular Exercise Tolerance: Good negative cardio ROS       Neuro/Psych negative neurological ROS  negative psych ROS   GI/Hepatic negative GI ROS, Neg liver ROS,   Endo/Other  negative endocrine ROS  Renal/GU negative Renal ROS  negative genitourinary   Musculoskeletal   Abdominal   Peds  Hematology negative hematology ROS (+)   Anesthesia Other Findings Past Medical History: No date: Femur fracture (HCC) No date: Hyperlipidemia   Reproductive/Obstetrics negative OB ROS                             Anesthesia Physical Anesthesia Plan  ASA: I  Anesthesia Plan: General   Post-op Pain Management:    Induction: Intravenous  PONV Risk Score and Plan: 2 and Propofol infusion  Airway Management Planned: Nasal Cannula  Additional Equipment:   Intra-op Plan:   Post-operative Plan:   Informed Consent: I have reviewed the patients History and Physical, chart, labs and discussed the procedure including the risks, benefits and alternatives for the proposed anesthesia with the patient or authorized representative who has indicated his/her understanding and acceptance.   Dental Advisory Given  Plan Discussed with: Anesthesiologist, CRNA and Surgeon  Anesthesia Plan Comments:         Anesthesia Quick Evaluation

## 2017-06-17 NOTE — Anesthesia Post-op Follow-up Note (Signed)
Anesthesia QCDR form completed.        

## 2017-06-17 NOTE — Op Note (Signed)
[facility] Gastroenterology Patient Name: Colton DolphinHerman Lichtman Procedure Date: 06/17/2017 10:26 AM MRN: [id number] Account #: [id number] Date of Birth: [birth year] Admit Type: Outpatient Age: [age] [facility] Gender: Male Note Status: Finalized Procedure:            Colonoscopy Indications:          Screening for colorectal malignant neoplasm Providers:            Wyline MoodKiran Jiovany Scheffel MD, MD Referring MD:         Karie Schwalbeichard I. Letvak (Referring MD) Medicines:            Monitored Anesthesia Care Complications:        No immediate complications. Procedure:            Pre-Anesthesia Assessment:                       - Prior to the procedure, a History and Physical was                        performed, and patient medications, allergies and                        sensitivities were reviewed. The patient's tolerance of                        previous anesthesia was reviewed.                       - The risks and benefits of the procedure and the                        sedation options and risks were discussed with the                        patient. All questions were answered and informed                        consent was obtained.                       - ASA Grade Assessment: II - A patient with mild                        systemic disease.                       After obtaining informed consent, the colonoscope was                        passed under direct vision. Throughout the procedure,                        the patient's blood pressure, pulse, and oxygen                        saturations were monitored continuously. The                        Colonoscope was introduced through the anus and  advanced to the the cecum, identified by the                        appendiceal orifice, IC valve and transillumination.                        The colonoscopy was performed with ease. The patient                        tolerated the procedure well. The quality  of the bowel                        preparation was adequate. Findings:      The perianal and digital rectal examinations were normal.      Non-bleeding internal hemorrhoids were found during retroflexion. The       hemorrhoids were medium-sized and Grade I (internal hemorrhoids that do       not prolapse).      A few small-mouthed diverticula were found in the right colon.      The exam was otherwise without abnormality on direct and retroflexion       views. Impression:           - Non-bleeding internal hemorrhoids.                       - Diverticulosis in the right colon.                       - The examination was otherwise normal on direct and                        retroflexion views.                       - No specimens collected. Recommendation:       - Discharge patient to home (with escort).                       - Resume previous diet.                       - Continue present medications.                       - Repeat colonoscopy in 10 years for screening purposes. Procedure Code(s):    --- Professional ---                       [phone], Colonoscopy, flexible; diagnostic, including                        collection of specimen(s) by brushing or washing, when                        performed (separate procedure) Diagnosis Code(s):    --- Professional ---                       Z12.11, Encounter for screening for malignant neoplasm                        of colon  K64.0, First degree hemorrhoids                       K57.30, Diverticulosis of large intestine without                        perforation or abscess without bleeding CPT copyright 2016 American Medical Association. All rights reserved. The codes documented in this report are preliminary and upon coder review may  be revised to meet current compliance requirements. Wyline Mood, MD Wyline Mood MD, MD 06/17/2017 10:56:27 AM This report has been signed electronically. Number of Addenda: 0 Note  Initiated On: 06/17/2017 10:26 AM Scope Withdrawal Time: 0 hours 16 minutes 57 seconds  Total Procedure Duration: 0 hours 19 minutes 34 seconds       [facility]

## 2017-06-17 NOTE — Anesthesia Postprocedure Evaluation (Signed)
Anesthesia Post Note  Patient: Colton Miller  Procedure(s) Performed: COLONOSCOPY WITH PROPOFOL (N/A )  Patient location during evaluation: Endoscopy Anesthesia Type: General Level of consciousness: awake and alert Pain management: pain level controlled Vital Signs Assessment: post-procedure vital signs reviewed and stable Respiratory status: spontaneous breathing, nonlabored ventilation, respiratory function stable and patient connected to nasal cannula oxygen Cardiovascular status: blood pressure returned to baseline and stable Postop Assessment: no apparent nausea or vomiting Anesthetic complications: no     Last Vitals:  Vitals:   06/17/17 1110 06/17/17 1120  BP: 124/88 (!) 122/92  Pulse: 75 65  Resp: 17 18  Temp:    SpO2: 98% 100%    Last Pain:  Vitals:   06/17/17 1050  TempSrc: Tympanic                 Lenard SimmerAndrew Maryelizabeth Eberle

## 2017-06-17 NOTE — H&P (Signed)
     Wyline MoodKiran Alanmichael Barmore, MD [address], [address], [place], [place], [address] [address], [address], [place], [place], [address] Phone: [phone]  Fax: [phone]  Primary Care Physician:  Karie SchwalbeLetvak, Richard I, MD   Pre-Procedure History & Physical: HPI:  Colton Miller is a 57 y.o. male is here for an colonoscopy.   Past Medical History:  Diagnosis Date  . Femur fracture (HCC)   . Hyperlipidemia     Past Surgical History:  Procedure Laterality Date  . LASIK     2004, then right redone 2008    Prior to Admission medications   Medication Sig Start Date End Date Taking? Authorizing Provider  aspirin-acetaminophen-caffeine (EXCEDRIN MIGRAINE) 2037239164250-250-65 MG tablet Take 1 tablet by mouth every 6 (six) hours as needed for headache.   Yes [provider]  oxyCODONE-acetaminophen (PERCOCET/ROXICET) 5-325 MG tablet Take 1 tablet 2 (two) times daily as needed by mouth. Patient not taking: Reported on 06/17/2017 03/19/17   Joaquim Namuncan, Graham S, MD    Allergies as of 06/08/2017  . (No Known Allergies)    Family History  Problem Relation Age of Onset  . Cancer Paternal Grandmother        lung cancer  . Stroke Mother   . Heart disease Father   . Cancer Paternal Aunt   . Diabetes Neg Hx   . Coronary artery disease Neg Hx     Social History   Socioeconomic History  . Marital status: Married    Spouse name: Not on file  . Number of children: 1  . Years of education: Not on file  . Highest education level: Not on file  Social Needs  . Financial resource strain: Not on file  . Food insecurity - worry: Not on file  . Food insecurity - inability: Not on file  . Transportation needs - medical: Not on file  . Transportation needs - non-medical: Not on file  Occupational History  . Occupation: Psychologist, occupationalBanker    Comment: [employer]  Tobacco Use  . Smoking status: Never Smoker  . Smokeless tobacco: Never Used  Substance and Sexual Activity  . Alcohol use: No  . Drug  use: Not on file  . Sexual activity: Not on file  Other Topics Concern  . Not on file  Social History Narrative  . Not on file    Review of Systems: See HPI, otherwise negative ROS  Physical Exam: BP 127/86   Pulse 82   Temp (!) 95.8 F (35.4 C) (Tympanic)   Resp 16   Ht 5\' 10"  (1.778 m)   Wt 219 lb (99.3 kg)   SpO2 100%   BMI 31.42 kg/m  General:   Alert,  pleasant and cooperative in NAD Head:  Normocephalic and atraumatic. Neck:  Supple; no masses or thyromegaly. Lungs:  Clear throughout to auscultation, normal respiratory effort.    Heart:  +S1, +S2, Regular rate and rhythm, No edema. Abdomen:  Soft, nontender and nondistended. Normal bowel sounds, without guarding, and without rebound.   Neurologic:  Alert and  oriented x4;  grossly normal neurologically.  Impression/Plan: Colton Miller is here for an colonoscopy to be performed for Screening colonoscopy average risk   Risks, benefits, limitations, and alternatives regarding  colonoscopy have been reviewed with the patient.  Questions have been answered.  All parties agreeable.   Wyline MoodKiran Sajad Glander, MD  06/17/2017, 10:11 AM

## 2017-06-17 NOTE — Transfer of Care (Signed)
Immediate Anesthesia Transfer of Care Note  Patient: Colton Miller  Procedure(s) Performed: COLONOSCOPY WITH PROPOFOL (N/A )  Patient Location: Endoscopy Unit  Anesthesia Type:General  Level of Consciousness: awake and alert   Airway & Oxygen Therapy: Patient Spontanous Breathing and Patient connected to nasal cannula oxygen  Post-op Assessment: Report given to RN and Post -op Vital signs reviewed and stable  Post vital signs: Reviewed and stable  Last Vitals:  Vitals:   06/17/17 0946 06/17/17 1050  BP: 127/86   Pulse: 82 80  Resp: 16 16  Temp: (!) 35.4 C 36.4 C  SpO2: 100% 100%    Last Pain:  Vitals:   06/17/17 1050  TempSrc: Tympanic         Complications: No apparent anesthesia complications

## 2017-08-26 ENCOUNTER — Other Ambulatory Visit (INDEPENDENT_AMBULATORY_CARE_PROVIDER_SITE_OTHER): Payer: BLUE CROSS/BLUE SHIELD

## 2017-08-26 DIAGNOSIS — R972 Elevated prostate specific antigen [PSA]: Secondary | ICD-10-CM | POA: Diagnosis not present

## 2017-08-26 DIAGNOSIS — E785 Hyperlipidemia, unspecified: Secondary | ICD-10-CM

## 2017-08-26 LAB — HEPATIC FUNCTION PANEL
ALT: 21 U/L (ref 0–53)
AST: 23 U/L (ref 0–37)
Albumin: 4 g/dL (ref 3.5–5.2)
Alkaline Phosphatase: 56 U/L (ref 39–117)
BILIRUBIN DIRECT: 0.1 mg/dL (ref 0.0–0.3)
Total Bilirubin: 0.7 mg/dL (ref 0.2–1.2)
Total Protein: 7.1 g/dL (ref 6.0–8.3)

## 2017-08-26 LAB — LIPID PANEL
CHOLESTEROL: 267 mg/dL — AB (ref 0–200)
HDL: 46.5 mg/dL (ref >39.00)
LDL Cholesterol: 189 mg/dL — ABNORMAL HIGH (ref 0–99)
NonHDL: 220.21
TRIGLYCERIDES: 158 mg/dL — AB (ref 0.0–149.0)
Total CHOL/HDL Ratio: 6
VLDL: 31.6 mg/dL (ref 0.0–40.0)

## 2017-08-27 ENCOUNTER — Other Ambulatory Visit: Payer: Self-pay | Admitting: Internal Medicine

## 2017-08-27 ENCOUNTER — Other Ambulatory Visit: Payer: Self-pay

## 2017-08-27 DIAGNOSIS — R972 Elevated prostate specific antigen [PSA]: Secondary | ICD-10-CM

## 2017-08-27 LAB — PSA, TOTAL AND FREE
PSA, % Free: 10 % (calc) — ABNORMAL LOW (ref >25)
PSA, Free: 0.6 ng/mL
PSA, TOTAL: 6.1 ng/mL — AB (ref <=4.0)

## 2017-08-27 MED ORDER — OXYCODONE-ACETAMINOPHEN 5-325 MG PO TABS: 1.0000 | ORAL_TABLET | Freq: Two times a day (BID) | ORAL | 0 refills | Status: DC | PRN

## 2017-08-27 NOTE — Telephone Encounter (Signed)
I called the pt to talk to him about his lab results and he asked for a refill of oxycodone.  Last done 03-19-17 #60 Last CSA/UDS 08-20-16.

## 2017-10-05 ENCOUNTER — Encounter: Payer: Self-pay | Admitting: Urology

## 2017-10-05 ENCOUNTER — Ambulatory Visit (INDEPENDENT_AMBULATORY_CARE_PROVIDER_SITE_OTHER): Payer: BLUE CROSS/BLUE SHIELD | Admitting: Urology

## 2017-10-05 VITALS — BP 134/87 | HR 74 | Ht 70.0 in | Wt 218.0 lb

## 2017-10-05 DIAGNOSIS — R972 Elevated prostate specific antigen [PSA]: Secondary | ICD-10-CM | POA: Diagnosis not present

## 2017-10-05 DIAGNOSIS — N432 Other hydrocele: Secondary | ICD-10-CM | POA: Diagnosis not present

## 2017-10-05 NOTE — Progress Notes (Signed)
10/05/2017 11:19 AM   Colton Miller 1960/05/20 [id number]  Referring provider: [name], MD [address] [place], [place] [address]  Chief Complaint  Patient presents with  . Elevated PSA    New Patient    HPI: 57 year old AAM referred for further evaluation of elevated PSA.  His most recent PSA on 08/26/2017 was 6.1.  This is risen since his most recent PSA on 03/17/2017 at 4.89.  Prior to this, his PSA was 3.73 in 09/2013.  He denies a family history of prostate cancer.  No urinary symptoms.  He occasionally has some urinary hesitancy.  Nocturia x 0.  He does endorse a good urinary stream and feels like he empties completely.  No urgency or frequency.  No dysuria or gross hematuria.  No UTIs.  He does note that he has had some pressure in his testicles over the last few weeks is new.  No pain or swelling.  No testicular trauma.  No weight loss or bone pain.    PMH: Past Medical History:  Diagnosis Date  . Femur fracture (HCC)   . Hyperlipidemia     Surgical History: Past Surgical History:  Procedure Laterality Date  . COLONOSCOPY WITH PROPOFOL N/A 06/17/2017   Procedure: COLONOSCOPY WITH PROPOFOL;  Surgeon: Wyline Mood, MD;  [facility] ENDOSCOPY;  Service: Gastroenterology;  Laterality: N/A;  . LASIK     2004, then right redone 2008    Home Medications:  Allergies as of 10/05/2017   No Known Allergies     Medication List        Accurate as of 10/05/17 11:19 AM. Always use your most recent med list.          aspirin-acetaminophen-caffeine 250-250-65 MG tablet Commonly known as:  EXCEDRIN MIGRAINE Take 1 tablet by mouth every 6 (six) hours as needed for headache.       Allergies: No Known Allergies  Family History: Family History  Problem Relation Age of Onset  . Cancer Paternal Grandmother        lung cancer  . Stroke Mother   . Heart disease Father   . Cancer Paternal Aunt   . Diabetes Neg Hx   . Coronary artery disease Neg  Hx     Social History:  reports that he has never smoked. He has never used smokeless tobacco. He reports that he does not drink alcohol. His drug history is not on file.  ROS: UROLOGY Frequent Urination?: Yes Hard to postpone urination?: Yes Burning/pain with urination?: No Get up at night to urinate?: No Leakage of urine?: No Urine stream starts and stops?: Yes Trouble starting stream?: Yes Do you have to strain to urinate?: No Blood in urine?: No Urinary tract infection?: No Sexually transmitted disease?: No Injury to kidneys or bladder?: No Painful intercourse?: No Weak stream?: No Erection problems?: No Penile pain?: No  Gastrointestinal Nausea?: No Vomiting?: No Indigestion/heartburn?: No Diarrhea?: No Constipation?: No  Constitutional Fever: No Night sweats?: No Weight loss?: No Fatigue?: No  Skin Skin rash/lesions?: No Itching?: No  Eyes Blurred vision?: No Double vision?: No  Ears/Nose/Throat Sore throat?: No Sinus problems?: No  Hematologic/Lymphatic Swollen glands?: No Easy bruising?: No  Cardiovascular Leg swelling?: No Chest pain?: No  Respiratory Cough?: No Shortness of breath?: No  Endocrine Excessive thirst?: No  Musculoskeletal Back pain?: No Joint pain?: No  Neurological Headaches?: No Dizziness?: No  Psychologic Depression?: No Anxiety?: No  Physical Exam: BP 134/87   Pulse 74  Ht 5\' 10"  (1.778 m)   Wt 218 lb (98.9 kg)   BMI 31.28 kg/m   Constitutional:  Alert and oriented, No acute distress. HEENT: [place] AT, moist mucus membranes.  Trachea midline, no masses. Cardiovascular: No clubbing, cyanosis, or edema. Respiratory: Normal respiratory effort, no increased work of breathing. GI: Abdomen is soft, nontender, nondistended, no abdominal masses.  No palpable inguinal hernias. GU: Circumcised meatus, no discharge.  Bilateral descended testicles, fullness around both testicles consistent with probable hydroceles,  testicles otherwise normal without tenderness or masses Rectal: Normal tone.  Enlarged 50+ cc prostate, nontender, no obvious nodules. Skin: No rashes, bruises or suspicious lesions. Neurologic: Grossly intact, no focal deficits, moving all 4 extremities. Psychiatric: Normal mood and affect.  Laboratory Data: Lab Results  Component Value Date   WBC 4.5 03/17/2017   HGB 14.7 03/17/2017   HCT 44.7 03/17/2017   MCV 88.2 03/17/2017   PLT 212.0 03/17/2017    Lab Results  Component Value Date   CREATININE 1.36 03/17/2017    Lab Results  Component Value Date   PSA 4.89 (H) 03/17/2017   PSA 3.73 09/19/2013   PSA 1.86 09/08/2011    Urinalysis N/A  Pertinent Imaging: N/A  Assessment & Plan:    1. Elevated PSA  We reviewed the implications of an elevated PSA and the uncertainty surrounding it. In general, a man's PSA increases with age and is produced by both normal and cancerous prostate tissue. Differential for elevated PSA is BPH, prostate cancer, infection, recent intercourse/ejaculation, prostate infarction, recent urethroscopic manipulation (foley placement/cystoscopy) and prostatitis. Management of an elevated PSA can include observation or prostate biopsy and wediscussed this in detail. We discussed that indications for prostate biopsy are defined by age and race specific PSA cutoffs as well as a PSA velocity of 0.75/year.  Rectal exam with prostamegaly today, otherwise unremarkable.    Please repeat PSA today.  If PSA remains elevated, would strongly recommend prostate biopsy given his age and overall excellent health.  We discussed prostate biopsy in detail including the procedure itself, the risks of blood in the urine, stool, and ejaculate, serious infection, and discomfort. He is willing to proceed with this as discussed.  - PSA  2. Other hydrocele Nonspecific fullness/pressure and scrotum Exam consistent with very subtle bilateral hydroceles No concern for  infection or hernia  Return for schedule prostate biopsy.   Vanna ScotlandAshley Jatniel Verastegui, MD  [facility] [address], [address] [place], [place] [address] [phone]

## 2017-10-06 ENCOUNTER — Telehealth: Payer: Self-pay

## 2017-10-06 LAB — PSA: PROSTATE SPECIFIC AG, SERUM: 4.8 ng/mL — AB (ref 0.0–4.0)

## 2017-10-06 NOTE — Telephone Encounter (Signed)
Pt informed, he will proceed with Biopsy as he feels its the safest route at this time.

## 2017-10-06 NOTE — Telephone Encounter (Signed)
-----   Message from Vanna ScotlandAshley Brandon, MD sent at 10/06/2017  9:39 AM EDT ----- PSA is actually coming down some, repeat PSA now 4.8.  This is still elevated for your age but the fact that it is trending down suggest there may be an inflammatory component here.  We can either proceed with prostate biopsy as discussed which would be the most conservative thing to do, alternatively have a return in 3 months for repeat PSA.  Vanna ScotlandAshley Brandon, MD

## 2017-11-23 ENCOUNTER — Encounter: Payer: Self-pay | Admitting: Urology

## 2017-11-23 ENCOUNTER — Ambulatory Visit (INDEPENDENT_AMBULATORY_CARE_PROVIDER_SITE_OTHER): Payer: BLUE CROSS/BLUE SHIELD | Admitting: Urology

## 2017-11-23 ENCOUNTER — Other Ambulatory Visit: Payer: Self-pay | Admitting: Urology

## 2017-11-23 VITALS — BP 137/83 | HR 68 | Ht 70.0 in | Wt 223.0 lb

## 2017-11-23 DIAGNOSIS — R972 Elevated prostate specific antigen [PSA]: Secondary | ICD-10-CM

## 2017-11-23 DIAGNOSIS — N4231 Prostatic intraepithelial neoplasia: Secondary | ICD-10-CM | POA: Diagnosis not present

## 2017-11-23 MED ORDER — LEVOFLOXACIN 500 MG PO TABS
500.0000 mg | ORAL_TABLET | Freq: Once | ORAL | Status: AC
  Administered 2017-11-23: 500 mg via ORAL

## 2017-11-23 MED ORDER — GENTAMICIN SULFATE 40 MG/ML IJ SOLN
80.0000 mg | Freq: Once | INTRAMUSCULAR | Status: AC
  Administered 2017-11-23: 80 mg via INTRAMUSCULAR

## 2017-11-23 NOTE — Progress Notes (Signed)
   11/23/17  CC:  Chief Complaint  Patient presents with  . Prostate Biopsy    HPI: 57 year old male with a history of elevated PSA up to 6.1 as of 08/2017.  This is improved to 4.8 on recheck but the patient has elected to pursue prostate biopsy which is reasonable given his age and other risk factors for prostate cancer.  Blood pressure 137/83, pulse 68, height 5\' 10"  (1.778 m), weight 223 lb (101.2 kg). NED. A&Ox3.   No respiratory distress   Abd soft, NT, ND Normal sphincter tone  Prostate Biopsy Procedure   Informed consent was obtained after discussing risks/benefits of the procedure.  A time out was performed to ensure correct patient identity.  Pre-Procedure: - Last PSA Level:  Lab Results  Component Value Date   PSA 4.89 (H) 03/17/2017   PSA 3.73 09/19/2013   PSA 1.86 09/08/2011   - Gentamicin given prophylactically - Levaquin 500 mg administered PO -Transrectal Ultrasound performed revealing a 73 gm prostate -No significant hypoechoic or median lobe noted  Procedure: - Prostate block performed using 10 cc 1% lidocaine and biopsies taken from sextant areas, a total of 12 under ultrasound guidance.  Post-Procedure: - Patient tolerated the procedure well - He was counseled to seek immediate medical attention if experiences any severe pain, significant bleeding, or fevers - Return in one to two weeks to discuss biopsy results   Vanna ScotlandAshley Davaun Quintela, MD

## 2017-11-30 ENCOUNTER — Other Ambulatory Visit: Payer: Self-pay | Admitting: Urology

## 2017-11-30 LAB — PATHOLOGY REPORT

## 2017-12-07 ENCOUNTER — Ambulatory Visit: Payer: BLUE CROSS/BLUE SHIELD | Admitting: Urology

## 2017-12-07 ENCOUNTER — Telehealth: Payer: Self-pay | Admitting: Urology

## 2017-12-07 NOTE — Telephone Encounter (Signed)
Prostate biopsy negative for malignancy.  He is called by telephone today to discuss this, all questions answered.  Planf for f/u in 6 months with PSA/IPSS.  He is agreeable this plan.  Vanna ScotlandAshley Quiara Killian, MD

## 2017-12-24 ENCOUNTER — Other Ambulatory Visit: Payer: Self-pay | Admitting: Internal Medicine

## 2017-12-24 MED ORDER — OXYCODONE-ACETAMINOPHEN 5-325 MG PO TABS: 1.0000 | ORAL_TABLET | Freq: Two times a day (BID) | ORAL | 0 refills | Status: DC | PRN

## 2017-12-24 NOTE — Telephone Encounter (Signed)
Pt notified refill of oxycodone apap was sent electronically to [facility] [address]. Pt voiced understanding.

## 2017-12-24 NOTE — Telephone Encounter (Signed)
Name of Medication: oxycodone apap 5-325 mg Name of Pharmacy: [facility] [address] Last Fill or Written Date and Quantity: # 60 on 08/27/17 Last Office Visit and Type: 03/18/17 annual Next Office Visit and Type: none scheduled Last Controlled Substance Agreement Date:08/20/16  Last UDS:08/20/16  Dr Alphonsus SiasLetvak out of office until 12/28/17.

## 2017-12-24 NOTE — Telephone Encounter (Signed)
Copied from CRM [phone]. Topic: Quick Communication - Rx Refill/Question >> Dec 24, 2017  2:36 PM Maia Pettiesrtiz, Kristie S wrote: Medication: oxyCODONE-acetaminophen (PERCOCET/ROXICET) 5-325 MG tablet - pt out of medication - he states this is a "standing order"  Has the patient contacted their pharmacy? Yes - told to call MD Preferred Pharmacy (with phone number or street name): [id number] [place], [place] - [address] [phone] (Phone) [phone] (Fax)

## 2018-05-25 ENCOUNTER — Telehealth: Payer: Self-pay

## 2018-05-25 NOTE — Telephone Encounter (Signed)
Name of Medication: oxycodone apap 5-325 Name of Pharmacy: [facility] [address] Last Fill or Written Date and Quantity: #60 on 12/24/17 Last Office Visit and Type: 03/18/17 annual Next Office Visit and Type: none scheduled Last Controlled Substance Agreement Date: 08/20/16 Last UDS:08/20/16

## 2018-05-26 MED ORDER — OXYCODONE-ACETAMINOPHEN 5-325 MG PO TABS: 1.0000 | ORAL_TABLET | Freq: Two times a day (BID) | ORAL | 0 refills | Status: DC | PRN

## 2018-05-26 NOTE — Telephone Encounter (Signed)
Have him schedule a physical in the next few months

## 2018-05-26 NOTE — Telephone Encounter (Signed)
Patient scheduled appointment on 07/22/18.

## 2018-05-26 NOTE — Telephone Encounter (Signed)
I left a message on patient's voice mail to call me back. °

## 2018-06-14 ENCOUNTER — Other Ambulatory Visit: Payer: BLUE CROSS/BLUE SHIELD

## 2018-06-14 ENCOUNTER — Other Ambulatory Visit: Payer: Self-pay

## 2018-06-14 DIAGNOSIS — R972 Elevated prostate specific antigen [PSA]: Secondary | ICD-10-CM

## 2018-06-15 LAB — PSA: PROSTATE SPECIFIC AG, SERUM: 5.1 ng/mL — AB (ref 0.0–4.0)

## 2018-06-21 ENCOUNTER — Encounter: Payer: Self-pay | Admitting: Urology

## 2018-06-21 ENCOUNTER — Ambulatory Visit (INDEPENDENT_AMBULATORY_CARE_PROVIDER_SITE_OTHER): Payer: BLUE CROSS/BLUE SHIELD | Admitting: Urology

## 2018-06-21 VITALS — BP 142/91 | HR 68 | Ht 70.0 in | Wt 229.0 lb

## 2018-06-21 DIAGNOSIS — N400 Enlarged prostate without lower urinary tract symptoms: Secondary | ICD-10-CM | POA: Diagnosis not present

## 2018-06-21 DIAGNOSIS — R972 Elevated prostate specific antigen [PSA]: Secondary | ICD-10-CM | POA: Diagnosis not present

## 2018-06-21 NOTE — Patient Instructions (Signed)
Saw Palmetto, Serenoa repens tablets and capsules What is this medicine? SAW PALMETTO (saw pal MET oh) is an herbal or dietary supplement. It is promoted to help support prostate health. The FDA has not approved this supplement for any medical use. This supplement may be used for other purposes; ask your health care provider or pharmacist if you have questions. This medicine may be used for other purposes; ask your health care provider or pharmacist if you have questions. What should I tell my health care provider before I take this medicine? They need to know if you have any of these conditions: -liver disease -prostate cancer -an unusual or allergic reaction to saw palmetto, other herbs, plants, medicines, foods, dyes, or preservatives -pregnant or trying to get pregnant -breast-feeding How should I use this medicine? Take this supplement by mouth with a glass of water. Follow the directions on the package labeling, or take as directed by your health care professional. If this supplement upsets your stomach, take it with food. Do not take this supplement more often than directed. Contact your pediatrician regarding the use of this supplement in children. Special care may be needed. Overdosage: If you think you have taken too much of this medicine contact a poison control center or emergency room at once. NOTE: This medicine is only for you. Do not share this medicine with others. What if I miss a dose? If you miss a dose, take it as soon as you can. If it is almost time for your next dose, take only that dose. Do not take double or extra doses. What may interact with this medicine? -other medicines for the prostate like finasteride, tamsulosin -hormones -warfarin This list may not describe all possible interactions. Give your health care provider a list of all the medicines, herbs, non-prescription drugs, or dietary supplements you use. Also tell them if you smoke, drink alcohol, or use  illegal drugs. Some items may interact with your medicine. What should I watch for while using this medicine? Tell your doctor or healthcare professional if your symptoms do not start to get better or if they get worse. If you are scheduled for any medical or dental procedure, tell your healthcare provider that you are taking this supplement. You may need to stop taking this supplement before the procedure. Herbal or dietary supplements are not regulated like medicines. Rigid quality control standards are not required for dietary supplements. The purity and strength of these products can vary. The safety and effect of this dietary supplement for a certain disease or illness is not well known. This product is not intended to diagnose, treat, cure or prevent any disease. The Food and Drug Administration suggests the following to help consumers protect themselves: -Always read product labels and follow directions. -Natural does not mean a product is safe for humans to take. -Look for products that include USP after the ingredient name. This means that the manufacturer followed the standards of the [place] Pharmacopoeia. -Supplements made or sold by a nationally known food or drug company are more likely to be made under tight controls. You can write to the company for more information about how the product was made. What side effects may I notice from receiving this medicine? Side effects that you should report to your doctor or health care professional as soon as possible: -allergic reactions like skin rash, itching or hives, swelling of the face, lips, or tongue -breathing problems -dark urine -fever -general ill feeling or flu-like symptoms -light-colored stools -loss  of appetite, nausea -right upper belly pain -trouble passing urine or change in the amount of urine -unusually weak or tired -yellowing of the eyes or skin Side effects that usually do not require medical attention (report to your  doctor or health care professional if they continue or are bothersome): -breast tenderness or enlargement -diarrhea -headache -stomach upset This list may not describe all possible side effects. Call your doctor for medical advice about side effects. You may report side effects to FDA at 1-800-FDA-1088. Where should I keep my medicine? Keep out of the reach of children. Store at room temperature between 15 and 30 degrees C (59 and 86 degrees F) or as directed on the package label. Protect from moisture. Throw away any unused supplement after the expiration date. NOTE: This sheet is a summary. It may not cover all possible information. If you have questions about this medicine, talk to your doctor, pharmacist, or health care provider.  2019 Elsevier/Gold Standard (2007-12-22 15:15:00)

## 2018-06-21 NOTE — Progress Notes (Signed)
06/21/2018 11:49 AM   Colton Miller January 25, 1961 [id number]  Referring provider: [name], MD [address] [place], [place] [address]  Chief Complaint  Patient presents with  . Elevated PSA    50month    HPI: 58 year old male with a personal history of elevated PSA who returns today for routine 30-month follow-up after prostate biopsy.  He was initially referred with elevated PSA of 6.1 on 08/2017.  PSA trend as below.  Rectal exam on 10/2017 showed enlarged 50+ cc prostate, nontender, no obvious nodules.  He underwent prostate biopsy on 11/30/2017 which showed no evidence of malignancy. TRUS vol 73 cc.    Today, he continues to deny any significant urinary symptoms.  He has a good stream and empties completely.  No gross hematuria or dysuria.  No UTIs.  IPSS as below.  He is taking dandy lion tea capsules for prostate health.    PSA trend: 5.1 06/25/2018 6.1 08/26/2017 4.89 03/17/2017 3.735/2015   IPSS    Row Name 06/21/18 1100         International Prostate Symptom Score   How often have you had the sensation of not emptying your bladder?  Less than 1 in 5     How often have you had to urinate less than every two hours?  Less than half the time     How often have you found you stopped and started again several times when you urinated?  Not at All     How often have you found it difficult to postpone urination?  Less than half the time     How often have you had a weak urinary stream?  Not at All     How often have you had to strain to start urination?  Not at All     How many times did you typically get up at night to urinate?  1 Time     Total IPSS Score  6       Quality of Life due to urinary symptoms   If you were to spend the rest of your life with your urinary condition just the way it is now how would you feel about that?  Mostly Satisfied        Score:  1-7 Mild 8-19 Moderate 20-35 Severe   PMH: Past Medical History:  Diagnosis Date  .  Femur fracture (HCC)   . Hyperlipidemia     Surgical History: Past Surgical History:  Procedure Laterality Date  . COLONOSCOPY WITH PROPOFOL N/A 06/17/2017   Procedure: COLONOSCOPY WITH PROPOFOL;  Surgeon: Wyline Mood, MD;  Location: [facility] ENDOSCOPY;  Service: Gastroenterology;  Laterality: N/A;  . LASIK     2004, then right redone 2008    Home Medications:  Allergies as of 06/21/2018   No Known Allergies     Medication List       Accurate as of June 21, 2018 11:49 AM. Always use your most recent med list.        aspirin-acetaminophen-caffeine 250-250-65 MG tablet Commonly known as:  EXCEDRIN MIGRAINE Take 1 tablet by mouth every 6 (six) hours as needed for headache.   oxyCODONE-acetaminophen 5-325 MG tablet Commonly known as:  PERCOCET/ROXICET Take 1 tablet by mouth 2 (two) times daily as needed.       Allergies: No Known Allergies  Family History: Family History  Problem Relation Age of Onset  . Cancer Paternal Grandmother        lung cancer  .  Stroke Mother   . Heart disease Father   . Cancer Paternal Aunt   . Diabetes Neg Hx   . Coronary artery disease Neg Hx     Social History:  reports that he has never smoked. He has never used smokeless tobacco. He reports that he does not drink alcohol. No history on file for drug.  ROS: UROLOGY Frequent Urination?: No Hard to postpone urination?: No Burning/pain with urination?: No Get up at night to urinate?: No Leakage of urine?: No Urine stream starts and stops?: No Trouble starting stream?: No Do you have to strain to urinate?: No Blood in urine?: No Urinary tract infection?: No Sexually transmitted disease?: No Injury to kidneys or bladder?: No Painful intercourse?: No Weak stream?: No Erection problems?: No Penile pain?: No  Gastrointestinal Nausea?: No Vomiting?: No Indigestion/heartburn?: No Diarrhea?: No Constipation?: No  Constitutional Fever: No Night sweats?: No Weight loss?:  No Fatigue?: No  Skin Skin rash/lesions?: No Itching?: No  Eyes Blurred vision?: No Double vision?: No  Ears/Nose/Throat Sore throat?: No Sinus problems?: No  Hematologic/Lymphatic Swollen glands?: No Easy bruising?: No  Cardiovascular Leg swelling?: No Chest pain?: No  Respiratory Cough?: No Shortness of breath?: No  Endocrine Excessive thirst?: No  Musculoskeletal Back pain?: No Joint pain?: No  Neurological Headaches?: No Dizziness?: No  Psychologic Depression?: No Anxiety?: No  Physical Exam: BP (!) 142/91   Pulse 68   Ht 5\' 10"  (1.778 m)   Wt 229 lb (103.9 kg)   BMI 32.86 kg/m   Constitutional:  Alert and oriented, No acute distress. HEENT: [place] AT, moist mucus membranes.  Trachea midline, no masses. Cardiovascular: No clubbing, cyanosis, or edema. Respiratory: Normal respiratory effort, no increased work of breathing. Skin: No rashes, bruises or suspicious lesions. Neurologic: Grossly intact, no focal deficits, moving all 4 extremities. Psychiatric: Normal mood and affect.  Laboratory Data: Lab Results  Component Value Date   WBC 4.5 03/17/2017   HGB 14.7 03/17/2017   HCT 44.7 03/17/2017   MCV 88.2 03/17/2017   PLT 212.0 03/17/2017    Lab Results  Component Value Date   CREATININE 1.36 03/17/2017    PSA trend as above  Assessment & Plan:    1. Elevated PSA Personal history of elevated PSA PSA trending downward, now 5.1 previously 6.1. Significant history of prostamegaly, may be appropriate for gland size Plan for repeat PSA/DRE in 1 year, low threshold for further diagnostic evaluation including prostate MR if his PSA starts trending back upwards - PSA; Future  2. BPH without obstruction/lower urinary tract symptoms Enlarged prostate with minimal urinary symptoms as above He is interested in discussing prostate health today as well as any herbal supplementation that can be taken which may help with symptoms, did briefly discuss  saw palmetto as an optionprovided with handouts   Return in about 1 year (around 06/22/2019) for PSA/ DRE.  [name], MD  [facility] [address], [address] [place], [place] [address] [phone]

## 2018-07-06 NOTE — Telephone Encounter (Signed)
ERROR

## 2018-07-22 ENCOUNTER — Encounter: Payer: BLUE CROSS/BLUE SHIELD | Admitting: Internal Medicine

## 2018-10-17 ENCOUNTER — Telehealth: Payer: Self-pay | Admitting: Internal Medicine

## 2018-10-17 MED ORDER — OXYCODONE-ACETAMINOPHEN 5-325 MG PO TABS: 1.0000 | ORAL_TABLET | Freq: Two times a day (BID) | ORAL | 0 refills | Status: DC | PRN

## 2018-10-17 NOTE — Telephone Encounter (Signed)
He is overdue for physical so I can only give #30. Set him up for PE in the near future

## 2018-10-17 NOTE — Telephone Encounter (Signed)
Patient called for a refill on Percocet.  [facility] uses [facility]

## 2018-10-17 NOTE — Telephone Encounter (Signed)
Name of Medication: Percocet [facility] [address] Last [place] or Written Date and Quantity: 05-26-18 #60 Last Office Visit and Type: 03-18-17 Next Office Visit and Type: No Future OV Last Controlled Substance Agreement Date: NA Last UDS: 08-20-16

## 2018-11-07 NOTE — Telephone Encounter (Signed)
Spoke to pt. Appt 11-15-18 at 730.

## 2018-11-15 ENCOUNTER — Ambulatory Visit: Payer: BLUE CROSS/BLUE SHIELD | Admitting: Internal Medicine

## 2018-11-16 ENCOUNTER — Ambulatory Visit (INDEPENDENT_AMBULATORY_CARE_PROVIDER_SITE_OTHER): Payer: BC Managed Care – PPO | Admitting: Internal Medicine

## 2018-11-16 ENCOUNTER — Encounter: Payer: Self-pay | Admitting: Internal Medicine

## 2018-11-16 DIAGNOSIS — E785 Hyperlipidemia, unspecified: Secondary | ICD-10-CM | POA: Diagnosis not present

## 2018-11-16 DIAGNOSIS — N4231 Prostatic intraepithelial neoplasia: Secondary | ICD-10-CM | POA: Diagnosis not present

## 2018-11-16 DIAGNOSIS — Z0000 Encounter for general adult medical examination without abnormal findings: Secondary | ICD-10-CM

## 2018-11-16 NOTE — Progress Notes (Signed)
Subjective:    Patient ID: Miachel RouxHerman A Miller, male    DOB: February 28, 1961, 58 y.o.   MRN: [id number]  HPI Virtual visit for yearly check up Identification done Billing reviewed and he gave consent He is home and I am in my office He is working from home---business has been okay  He has some aches and pains Uses the oxycodone for severe pain in shoulders or upper back--rare  Tingling in fingers in left hand--4th and 5th Discussed looking into his arm position while working on computer  Has fitness equipment--using glider and treadmill Trying to be carefull with eating---at home all the time  Current Outpatient Medications on File Prior to Visit  Medication Sig Dispense Refill  . aspirin-acetaminophen-caffeine (EXCEDRIN MIGRAINE) 250-250-65 MG tablet Take 1 tablet by mouth every 6 (six) hours as needed for headache.    . oxyCODONE-acetaminophen (PERCOCET/ROXICET) 5-325 MG tablet Take 1 tablet by mouth 2 (two) times daily as needed. 30 tablet 0   No current facility-administered medications on file prior to visit.     No Known Allergies  Past Medical History:  Diagnosis Date  . Femur fracture (HCC)   . Hyperlipidemia     Past Surgical History:  Procedure Laterality Date  . COLONOSCOPY WITH PROPOFOL N/A 06/17/2017   Procedure: COLONOSCOPY WITH PROPOFOL;  Surgeon: Wyline MoodAnna, Kiran, MD;  Location[facility] ENDOSCOPY;  Service: Gastroenterology;  Laterality: N/A;  . LASIK     2004, then right redone 2008    Family History  Problem Relation Age of Onset  . Cancer Paternal Grandmother        lung cancer  . Stroke Mother   . Heart disease Father   . Cancer Paternal Aunt   . Diabetes Neg Hx   . Coronary artery disease Neg Hx     Social History   Socioeconomic History  . Marital status: Married    Spouse name: Not on file  . Number of children: 1  . Years of education: Not on file  . Highest education level: Not on file  Occupational History  . Occupation: Psychologist, occupationalBanker    Comment:  [employer]  Social Needs  . Financial resource strain: Not on file  . Food insecurity    Worry: Not on file    Inability: Not on file  . Transportation needs    Medical: Not on file    Non-medical: Not on file  Tobacco Use  . Smoking status: Never Smoker  . Smokeless tobacco: Never Used  Substance and Sexual Activity  . Alcohol use: No  . Drug use: Not on file  . Sexual activity: Not on file  Lifestyle  . Physical activity    Days per week: Not on file    Minutes per session: Not on file  . Stress: Not on file  Relationships  . Social Musicianconnections    Talks on phone: Not on file    Gets together: Not on file    Attends religious service: Not on file    Active member of club or organization: Not on file    Attends meetings of clubs or organizations: Not on file    Relationship status: Not on file  . Intimate partner violence    Fear of current or ex partner: Not on file    Emotionally abused: Not on file    Physically abused: Not on file    Forced sexual activity: Not on file  Other Topics Concern  . Not on file  Social  History Narrative  . Not on file   Review of Systems  Constitutional: Negative for fatigue.       Weight has drifted up Wears seat belt  HENT: Negative for dental problem, hearing loss and tinnitus.        Keeps up with dentist  Eyes:       Some blurry vision--- ?related to working at different distances with phone/computer  Respiratory: Negative for cough, chest tightness and shortness of breath.   Cardiovascular: Negative for chest pain and palpitations.       Slight ankle swelling  Gastrointestinal: Negative for abdominal pain, blood in stool and constipation.       Occasional sharp pain in left upper abdomen or lower chest---very brief. Like a cramp No heartburn  Endocrine: Negative for polydipsia and polyuria.  Genitourinary: Negative for difficulty urinating and urgency.       Rare slow stream No sexual problems  Musculoskeletal:  Positive for arthralgias and back pain. Negative for joint swelling.  Skin: Negative for rash.  Allergic/Immunologic: Negative for environmental allergies and immunocompromised state.  Neurological: Negative for dizziness, syncope, light-headedness and headaches.  Hematological: Negative for adenopathy. Does not bruise/bleed easily.  Psychiatric/Behavioral: Negative for dysphoric mood and sleep disturbance. The patient is not nervous/anxious.        Objective:   Physical Exam  Constitutional: He appears well-developed. No distress.  Respiratory: Effort normal. No respiratory distress.  Psychiatric: He has a normal mood and affect. His behavior is normal.           Assessment & Plan:

## 2018-11-16 NOTE — Assessment & Plan Note (Signed)
Healthy Discussed fitness Colon due again 2029 Tends to avoid flu vaccine---urged him to take it this year Due for Td---will do it for any injury or at next OV

## 2018-11-16 NOTE — Assessment & Plan Note (Signed)
He is working on lifestyle Will plan to recheck next year

## 2018-11-16 NOTE — Assessment & Plan Note (Signed)
Will follow up with Dr Erlene Quan

## 2019-06-23 ENCOUNTER — Other Ambulatory Visit: Payer: BLUE CROSS/BLUE SHIELD

## 2019-06-28 ENCOUNTER — Ambulatory Visit: Payer: BLUE CROSS/BLUE SHIELD | Admitting: Urology

## 2019-08-14 ENCOUNTER — Other Ambulatory Visit: Payer: Self-pay | Admitting: *Deleted

## 2019-08-14 DIAGNOSIS — R972 Elevated prostate specific antigen [PSA]: Secondary | ICD-10-CM

## 2019-08-15 ENCOUNTER — Other Ambulatory Visit: Payer: Self-pay

## 2019-08-15 ENCOUNTER — Other Ambulatory Visit: Payer: BC Managed Care – PPO

## 2019-08-15 DIAGNOSIS — R972 Elevated prostate specific antigen [PSA]: Secondary | ICD-10-CM | POA: Diagnosis not present

## 2019-08-16 LAB — PSA: Prostate Specific Ag, Serum: 7 ng/mL — ABNORMAL HIGH (ref 0.0–4.0)

## 2019-08-21 NOTE — Progress Notes (Signed)
08/22/19 10:20 AM   Colton Miller Jun 04, 1960 [id number]  Referring provider: Venia Carbon, MD [address] [place],  [place] [address]  Chief Complaint  Patient presents with   Elevated PSA    HPI: Colton Miller is a 59 y.o. M with a personal history of elevated PSA s/p prostate bx returns today for 1 year f/u.    He was initially referred with elevated PSA of 6.1 on 08/2017.  PSA trend as below.  Rectal exam on 10/2017 showed enlarged 50+ cc prostate, nontender, no obvious nodules.  He underwent prostate biopsy on 11/30/2017 which showed no evidence of malignancy. TRUS vol 73 cc.    No significant urinary symptoms. He has been taking Saw Palmetto for which he has seen improvement in his urine flow. He is satisfied. No gross hematuria or dysuria. No UTIs.   PSA trend: 7.0 08/15/2019 5.1 06/25/2018 6.1 08/26/2017 4.89 03/17/2017 3.735/2015    PMH: Past Medical History:  Diagnosis Date   Femur fracture ([place])    Hyperlipidemia     Surgical History: Past Surgical History:  Procedure Laterality Date   COLONOSCOPY WITH PROPOFOL N/A 06/17/2017   Procedure: COLONOSCOPY WITH PROPOFOL;  Surgeon: Jonathon Bellows, MD;  Location: [facility] ENDOSCOPY;  Service: Gastroenterology;  Laterality: N/A;   LASIK     2004, then right redone 2008    Home Medications:  Allergies as of 08/22/2019   No Known Allergies     Medication List       Accurate as of August 22, 2019 11:59 PM. If you have any questions, ask your nurse or doctor.        aspirin-acetaminophen-caffeine 250-250-65 MG tablet Commonly known as: EXCEDRIN MIGRAINE Take 1 tablet by mouth every 6 (six) hours as needed for headache.   oxyCODONE-acetaminophen 5-325 MG tablet Commonly known as: PERCOCET/ROXICET Take 1 tablet by mouth 2 (two) times daily as needed.   Saw Palmetto 1000 MG Caps Take by mouth.       Allergies: No Known Allergies  Family History: Family History  Problem Relation Age of Onset    Cancer Paternal Grandmother        lung cancer   Stroke Mother    Heart disease Father    Cancer Paternal Aunt    Diabetes Neg Hx    Coronary artery disease Neg Hx     Social History:  reports that he has never smoked. He has never used smokeless tobacco. He reports that he does not drink alcohol. No history on file for drug.   Physical Exam: BP (!) 142/97    Pulse 66    Ht 5\' 10"  (1.778 m)    Wt 223 lb (101.2 kg)    BMI 32.00 kg/m   Constitutional:  Alert and oriented, No acute distress. HEENT: [place] AT, moist mucus membranes.  Trachea midline, no masses. Cardiovascular: No clubbing, cyanosis, or edema. Respiratory: Normal respiratory effort, no increased work of breathing. Rectal: Normal sphincter tone, 50+g prostate, slight induration or nodularity on left lateral apex.  Skin: No rashes, bruises or suspicious lesions. Neurologic: Grossly intact, no focal deficits, moving all 4 extremities. Psychiatric: Normal mood and affect.  Assessment & Plan:    1. Elevated/Rising PSA/ prostate nodule Upper trend in PSA from 5.1 to 7.0 in 1 year.  DRE today indicated new slight induration or nodularity on left lateral apex.  Given previous negative bx, recommended prostate MRI to rule out occult vs. significant tumors/lesions.  Scheduled for MRI  prostate and will call back w/ results   2. BPH w/o obstruction/LUTS Currently taking Saw Palmetto w/ good effect   No follow-ups on file.  [facility] [address], [address] [place], [place] [address] [phone]  I, Colton Miller, am acting as a scribe for Dr. [name],  I have reviewed the above documentation for accuracy and completeness, and I agree with the above.   [name], MD  I spent 30 total minutes on the day of the encounter including pre-visit review of the medical record, face-to-face time with the patient, and post visit ordering of labs/imaging/tests.

## 2019-08-22 ENCOUNTER — Other Ambulatory Visit: Payer: Self-pay

## 2019-08-22 ENCOUNTER — Ambulatory Visit (INDEPENDENT_AMBULATORY_CARE_PROVIDER_SITE_OTHER): Payer: BC Managed Care – PPO | Admitting: Urology

## 2019-08-22 VITALS — BP 142/97 | HR 66 | Ht 70.0 in | Wt 223.0 lb

## 2019-08-22 DIAGNOSIS — N400 Enlarged prostate without lower urinary tract symptoms: Secondary | ICD-10-CM | POA: Diagnosis not present

## 2019-08-22 DIAGNOSIS — R972 Elevated prostate specific antigen [PSA]: Secondary | ICD-10-CM | POA: Diagnosis not present

## 2019-09-12 ENCOUNTER — Other Ambulatory Visit: Payer: Self-pay | Admitting: *Deleted

## 2019-09-12 MED ORDER — OXYCODONE-ACETAMINOPHEN 5-325 MG PO TABS: 1.0000 | ORAL_TABLET | Freq: Two times a day (BID) | ORAL | 0 refills | Status: DC | PRN

## 2019-09-12 NOTE — Telephone Encounter (Signed)
Patient left a voicemail requesting a refill  Name of Medication: Oxycodone [facility] [place] or Written Date and Quantity: 10/17/18 #30 Last Office Visit and Type: 11/16/18 Next Office Visit and Type: 12/07/19 Last Controlled Substance Agreement Date: 09/16/12 Last UDS:08/20/16

## 2019-09-15 NOTE — Telephone Encounter (Signed)
Pt left v/m requesting status of refill for oxycodone apap requested on 09/12/19. I spoke with MIke at [facility] and he said the rx is on hold; insurance denied because for first opiod rx would only allow 7 days. Can fill for qty of # 14 and discard the remainder of # 30. If pt needs more med then ins would allow a larger qty. Kathlene November said to give them 2 hrs to prepare refill. Pt notified and  voiced understanding.

## 2019-09-20 ENCOUNTER — Other Ambulatory Visit: Payer: Self-pay

## 2019-09-20 ENCOUNTER — Ambulatory Visit
Admission: RE | Admit: 2019-09-20 | Discharge: 2019-09-20 | Disposition: A | Discharge status: Home / Self Care | Payer: BC Managed Care – PPO | Source: Ambulatory Visit | Attending: Urology | Admitting: Urology

## 2019-09-20 DIAGNOSIS — R972 Elevated prostate specific antigen [PSA]: Secondary | ICD-10-CM | POA: Diagnosis not present

## 2019-09-20 MED ORDER — GADOBUTROL 1 MMOL/ML IV SOLN
10.0000 mL | Freq: Once | INTRAVENOUS | Status: AC | PRN
  Administered 2019-09-20: 10 mL via INTRAVENOUS

## 2019-09-25 NOTE — Progress Notes (Signed)
Virtual Visit via Video Note  I connected with Colton Miller on 09/26/19 at  1:00 PM EDT by a video enabled telemedicine application and verified that I am speaking with the correct person using two identifiers.  Location: Patient: home Provider: office   I discussed the limitations of evaluation and management by telemedicine and the availability of in person appointments. The patient expressed understanding and agreed to proceed.  History of Present Illness: Colton Miller is a 59 y.o. M with a personal history of elevated PSA s/p prostate bx returns today for f/u.   He was initially referred with elevated PSA of 6.1 on 08/2017. PSA trend as below. Rectal exam on 10/2017 showed enlarged 50+ cc prostate, nontender, no obvious nodules.  He underwent prostate biopsy on 11/30/2017 which showed no evidence of malignancy.TRUS vol 73 cc.  No significant urinary symptoms. He has been taking Saw Palmetto for which he has seen improvement in his urine flow. He is satisfied. No gross hematuria or dysuria. No UTIs.   MR Prostate w wo contrast revealed 3 cm peripheral zone nodule in the left posterolateral apex, suspicious high-grade carcinoma. PI-RADS 4: High (clinically significant cancer is likely to be present). 4 cm transition zone nodule in the left anterior mid gland, suspicious for high-grade carcinoma. PI-RADS 4: High (clinically significant cancer is likely to be present) No evidence of extracapsular extension.  PSAtrend: 7.0 08/15/2019 5.1 06/25/2018 6.1 08/26/2017 4.89 03/17/2017 3.73 09/2013   Observations/Objective: Pt is engaged and asking good questions.   Pertinent Imagings:  CLINICAL DATA:  Elevated PSA.  EXAM: MR PROSTATE WITHOUT AND WITH CONTRAST  TECHNIQUE: Multiplanar multisequence MRI images were obtained of the pelvis centered about the prostate. Pre and post contrast images were obtained.  CONTRAST:  29mL GADAVIST GADOBUTROL 1 MMOL/ML IV SOLN  COMPARISON:   None.  FINDINGS: Prostate:  -- Peripheral Zone: A T2 hypointense nodule is seen in the left posterolateral apex, which measures 1.3 x 0.9 cm on image 60/9. This nodule shows marked ADC hypointensity and DWI hyperintensity, but no early focal contrast enhancement.  -- Transition/Central Zone: Small well-circumscribed BPH nodules are noted. An ill-defined in particular shaped area of T2 hypointensity is seen in the left anterior mid gland. This measures 1.4 x 0.9 cm on image 18/4, and shows marked ADC hypointensity and DWI hyperintensity, but no early focal contrast enhancement.  -- Measurements/Volume:  4.4 x 5.0 x 5.0 cm (volume = 58 cm^3)  Transcapsular spread:  Absent  Seminal vesicle involvement:  Absent  Neurovascular bundle involvement:  Absent  Pelvic adenopathy: 10 mm right external iliac lymph node is seen. Other sub-cm bilateral iliac lymph nodes are seen which are not pathologically enlarged.  Bone metastasis: None visualized  Other:  None  IMPRESSION: 1.3 cm peripheral zone nodule in the left posterolateral apex, suspicious high-grade carcinoma. PI-RADS 4: High (clinically significant cancer is likely to be present)  1.4 cm transition zone nodule in the left anterior mid gland, suspicious for high-grade carcinoma. PI-RADS 4: High (clinically significant cancer is likely to be present)  No evidence of extracapsular extension.  Single nonspecific 10 mm right external iliac lymph node.   Electronically Signed   By: Danae Orleans M.D.   On: 09/22/2019 14:15  I have personally reviewed the images and agree with radiologist interpretation.   Assessment and Plan:  1. Elevated PSA/ prostate nodule Upper trend in PSA from 5.1 to 7.0 in 1 year.  DRE from previous visit indicated new slight induration  or nodularity on left lateral apex.  Previous bx negative MR prostate w wo contrast revealed PI-RADS 4 suspicious for high-grade carcinoma  We  discussed fusion prostate biopsy in detail including the procedure itself, the risks of blood in the urine, stool, and ejaculate, serious infection, and discomfort. He is willing to proceed with this as discussed. Pt will be called for fusion bx appt.   Follow Up Instructions:  I discussed the assessment and treatment plan with the patient. The patient was provided an opportunity to ask questions and all were answered. The patient agreed with the plan and demonstrated an understanding of the instructions.   The patient was advised to call back or seek an in-person evaluation if the symptoms worsen or if the condition fails to improve as anticipated.  I provided 15 minutes of non-face-to-face time during this encounter.  Jamas Lav, am acting as a scribe for Dr. Hollice Espy,  I have reviewed the above documentation for accuracy and completeness, and I agree with the above.   Hollice Espy, MD

## 2019-09-26 ENCOUNTER — Other Ambulatory Visit: Payer: Self-pay

## 2019-09-26 ENCOUNTER — Telehealth (INDEPENDENT_AMBULATORY_CARE_PROVIDER_SITE_OTHER): Payer: BC Managed Care – PPO | Admitting: Urology

## 2019-09-26 DIAGNOSIS — R972 Elevated prostate specific antigen [PSA]: Secondary | ICD-10-CM | POA: Diagnosis not present

## 2019-09-26 NOTE — Progress Notes (Signed)
This service is provided via telemedicine   No vital signs collected/recorded due to the encounter was a telemedicine visit.     Patient consents to a telephone visit: Yes    Names of all persons participating in the telemedicine service and their role in the encounter:  I, Milas Kocher, CMA reviewed all medications, pharmacy and history. No other changes.

## 2019-10-04 ENCOUNTER — Other Ambulatory Visit: Payer: Self-pay

## 2019-10-05 MED ORDER — OXYCODONE-ACETAMINOPHEN 5-325 MG PO TABS: 1.0000 | ORAL_TABLET | Freq: Two times a day (BID) | ORAL | 0 refills | Status: DC | PRN

## 2019-10-05 NOTE — Telephone Encounter (Signed)
I did verify he was given #14. We will have to put a chronic dx code on the refill for him to get the entire amount. I put it on the rx to be signed as chronic low back pain.

## 2019-10-06 ENCOUNTER — Encounter: Payer: Self-pay | Admitting: Urology

## 2019-10-19 DIAGNOSIS — R972 Elevated prostate specific antigen [PSA]: Secondary | ICD-10-CM | POA: Diagnosis not present

## 2019-10-26 NOTE — Progress Notes (Signed)
10/27/19 11:58 AM   Colton Miller 12/06/1960 [id number]  Referring provider: Venia Carbon, MD [address] [place],  [place] [address] Chief Complaint  Patient presents with  . Follow-up    Fusion Bx results    HPI: Colton Miller is a 59 y.o. M with a personal history of elevated PSA s/p prostate bx returns today for f/u.   He was initially referred with elevated PSA of 6.1 on 08/2017. PSA trend as below. Rectal exam on 10/2017 showed enlarged 50+ cc prostate, nontender, no obvious nodules.  He underwent prostate biopsy on 11/30/2017 which showed no evidence of malignancy.TRUS vol 73 cc.  No significant urinary symptoms. He has been taking Saw Palmetto for which he has seen improvement in his urine flow. He is satisfied. No gross hematuria or dysuria. No UTIs.  MR Prostate w wo contrast from 09/20/19 revealed 3 cm peripheral zone nodule in the left posterolateral apex, suspicious high-grade carcinoma. PI-RADS 4: High (clinically significant cancer is likely to be present). 4 cm transition zone nodule in the left anterior mid gland, suspicious for high-grade carcinoma. PI-RADS 4: High (clinically significant cancer is likely to be present) No evidence of extracapsular extension.  His fusion bx from 10/19/19 revealed few foci of high grade PIN but no malignancy.  He reports of doing well w/ no bothersome urinary symptoms.   PSAtrend: 7.0 08/15/2019 5.1 06/25/2018 6.1 08/26/2017 4.89 03/17/2017 3.73 09/2013  PMH: Past Medical History:  Diagnosis Date  . Femur fracture ([place])   . Hyperlipidemia     Surgical History: Past Surgical History:  Procedure Laterality Date  . COLONOSCOPY WITH PROPOFOL N/A 06/17/2017   Procedure: COLONOSCOPY WITH PROPOFOL;  Surgeon: Jonathon Bellows, MD;  Location: [facility] ENDOSCOPY;  Service: Gastroenterology;  Laterality: N/A;  . LASIK     2004, then right redone 2008    Home Medications:  Allergies as of 10/27/2019   No Known  Allergies     Medication List       Accurate as of October 27, 2019 11:59 PM. If you have any questions, ask your nurse or doctor.        aspirin-acetaminophen-caffeine 250-250-65 MG tablet Commonly known as: EXCEDRIN MIGRAINE Take 1 tablet by mouth every 6 (six) hours as needed for headache.   oxyCODONE-acetaminophen 5-325 MG tablet Commonly known as: PERCOCET/ROXICET Take 1 tablet by mouth 2 (two) times daily as needed. Dx Code M54.4 (Chronic Low Back Pain)   Saw Palmetto 1000 MG Caps Take by mouth.       Allergies: No Known Allergies  Family History: Family History  Problem Relation Age of Onset  . Cancer Paternal Grandmother        lung cancer  . Stroke Mother   . Heart disease Father   . Cancer Paternal Aunt   . Diabetes Neg Hx   . Coronary artery disease Neg Hx     Social History:  reports that he has never smoked. He has never used smokeless tobacco. He reports that he does not drink alcohol. No history on file for drug use.   Physical Exam: BP 128/84   Pulse 72   Ht 5\' 10"  (1.778 m)   Wt 228 lb (103.4 kg)   BMI 32.71 kg/m   Constitutional:  Alert and oriented, No acute distress. HEENT: [place] AT, moist mucus membranes.  Trachea midline, no masses. Cardiovascular: No clubbing, cyanosis, or edema. Respiratory: Normal respiratory effort, no increased work of breathing. Skin: No rashes, bruises  or suspicious lesions. Neurologic: Grossly intact, no focal deficits, moving all 4 extremities. Psychiatric: Normal mood and affect.  Assessment & Plan:    1. Elevated/Rising PSA/ prostate nodule Fusion bx from 10/19/19 revealed few foci of high grade PIN but no malignancy. Will follow conservatively  Return in 6 months w/ PSA prior   2. BPH w/o obstruction/LUTS Currently taking Saw Palmetto w/ good effect    [facility] [address] [place], [place] [address] [phone]  I, [name], am acting as a scribe  for Dr. [name],  I have reviewed the above documentation for accuracy and completeness, and I agree with the above.   [name], MD

## 2019-10-27 ENCOUNTER — Ambulatory Visit (INDEPENDENT_AMBULATORY_CARE_PROVIDER_SITE_OTHER): Payer: BC Managed Care – PPO | Admitting: Urology

## 2019-10-27 ENCOUNTER — Other Ambulatory Visit: Payer: Self-pay | Admitting: Urology

## 2019-10-27 ENCOUNTER — Other Ambulatory Visit: Payer: Self-pay

## 2019-10-27 ENCOUNTER — Encounter: Payer: Self-pay | Admitting: Urology

## 2019-10-27 VITALS — BP 128/84 | HR 72 | Ht 70.0 in | Wt 228.0 lb

## 2019-10-27 DIAGNOSIS — R972 Elevated prostate specific antigen [PSA]: Secondary | ICD-10-CM

## 2019-11-22 ENCOUNTER — Encounter: Payer: BC Managed Care – PPO | Admitting: Internal Medicine

## 2019-12-07 ENCOUNTER — Encounter: Payer: Self-pay | Admitting: Internal Medicine

## 2019-12-07 ENCOUNTER — Ambulatory Visit (INDEPENDENT_AMBULATORY_CARE_PROVIDER_SITE_OTHER): Payer: BC Managed Care – PPO | Admitting: Internal Medicine

## 2019-12-07 ENCOUNTER — Other Ambulatory Visit: Payer: Self-pay

## 2019-12-07 VITALS — BP 120/80 | HR 83 | Temp 98.4°F | Ht 69.0 in | Wt 227.5 lb

## 2019-12-07 DIAGNOSIS — Z0000 Encounter for general adult medical examination without abnormal findings: Secondary | ICD-10-CM

## 2019-12-07 DIAGNOSIS — Z23 Encounter for immunization: Secondary | ICD-10-CM

## 2019-12-07 DIAGNOSIS — E785 Hyperlipidemia, unspecified: Secondary | ICD-10-CM | POA: Diagnosis not present

## 2019-12-07 NOTE — Progress Notes (Signed)
Subjective:    Patient ID: Colton Miller, male    DOB: July 14, 1960, 59 y.o.   MRN: [id number]  HPI Here for physical This visit occurred during the SARS-CoV-2 public health emergency.  Safety protocols were in place, including screening questions prior to the visit, additional usage of staff PPE, and extensive cleaning of exam room while observing appropriate contact time as indicated for disinfecting solutions.   Had prostate biopsy Some PIN but no cancer Voids okay on saw palmetto No sexual problems  Back has been okay Episodic pain--especially after trips to [place] to see son and family  Working remotely This is working out okay  Current Outpatient Medications on File Prior to Visit  Medication Sig Dispense Refill  . aspirin-acetaminophen-caffeine (EXCEDRIN MIGRAINE) 250-250-65 MG tablet Take 1 tablet by mouth every 6 (six) hours as needed for headache.    . oxyCODONE-acetaminophen (PERCOCET/ROXICET) 5-325 MG tablet Take 1 tablet by mouth 2 (two) times daily as needed. Dx Code M54.4 (Chronic Low Back Pain) 60 tablet 0  . Saw Palmetto 1000 MG CAPS Take by mouth.     No current facility-administered medications on file prior to visit.    No Known Allergies  Past Medical History:  Diagnosis Date  . Femur fracture ([facility])   . Hyperlipidemia     Past Surgical History:  Procedure Laterality Date  . COLONOSCOPY WITH PROPOFOL N/A 06/17/2017   Procedure: COLONOSCOPY WITH PROPOFOL;  Surgeon: Wyline Mood, MD;  Location: [facility] ENDOSCOPY;  Service: Gastroenterology;  Laterality: N/A;  . LASIK     2004, then right redone 2008    Family History  Problem Relation Age of Onset  . Cancer Paternal Grandmother        lung cancer  . Stroke Mother   . Heart disease Father   . Cancer Paternal Aunt   . Diabetes Neg Hx   . Coronary artery disease Neg Hx     Social History   Socioeconomic History  . Marital status: Married    Spouse name: Not on file  . Number of children: 1  . Years  of education: Not on file  . Highest education level: Not on file  Occupational History  . Occupation: Psychologist, occupational    Comment: [employer]  Tobacco Use  . Smoking status: Never Smoker  . Smokeless tobacco: Never Used  Substance and Sexual Activity  . Alcohol use: No  . Drug use: Not on file  . Sexual activity: Not on file  Other Topics Concern  . Not on file  Social History Narrative  . Not on file   Social Determinants of Health   Financial Resource Strain:   . Difficulty of Paying Living Expenses:   Food Insecurity:   . Worried About Programme researcher, broadcasting/film/video in the Last Year:   . Barista in the Last Year:   Transportation Needs:   . Freight forwarder (Medical):   Marland Kitchen Lack of Transportation (Non-Medical):   Physical Activity:   . Days of Exercise per Week:   . Minutes of Exercise per Session:   Stress:   . Feeling of Stress :   Social Connections:   . Frequency of Communication with Friends and Family:   . Frequency of Social Gatherings with Friends and Family:   . Attends Religious Services:   . Active Member of Clubs or Organizations:   . Attends Banker Meetings:   Marland Kitchen Marital Status:   Intimate Partner Violence:   .  Fear of Current or Ex-Partner:   . Emotionally Abused:   Marland Kitchen Physically Abused:   . Sexually Abused:    Review of Systems  Constitutional: Negative for fatigue.       Starting to exercise again now Weight is up but stable over this year Wears seat belt  HENT: Negative for dental problem, hearing loss and tinnitus.        Keeps up with dentist  Eyes: Negative for visual disturbance.       No diplopia or unilateral vision loss  Respiratory: Negative for cough, chest tightness and shortness of breath.   Cardiovascular: Negative for chest pain, palpitations and leg swelling.  Gastrointestinal: Negative for blood in stool and constipation.       No heartburn  Endocrine: Negative for polydipsia and polyuria.  Genitourinary:  Negative for difficulty urinating and urgency.  Musculoskeletal: Positive for back pain. Negative for arthralgias and joint swelling.  Skin: Negative for rash.  Allergic/Immunologic: Negative for environmental allergies and immunocompromised state.  Neurological: Negative for dizziness, syncope, light-headedness and headaches.  Hematological: Negative for adenopathy. Does not bruise/bleed easily.  Psychiatric/Behavioral: Negative for dysphoric mood and sleep disturbance. The patient is not nervous/anxious.        Objective:   Physical Exam Constitutional:      Appearance: Normal appearance.  HENT:     Head: Normocephalic and atraumatic.     Right Ear: Tympanic membrane and ear canal normal.     Left Ear: Tympanic membrane and ear canal normal.  Eyes:     Conjunctiva/sclera: Conjunctivae normal.     Pupils: Pupils are equal, round, and reactive to light.  Cardiovascular:     Rate and Rhythm: Normal rate and regular rhythm.     Pulses: Normal pulses.     Heart sounds: No murmur heard.  No gallop.   Pulmonary:     Effort: Pulmonary effort is normal.     Breath sounds: Normal breath sounds. No wheezing or rales.  Abdominal:     Palpations: Abdomen is soft.     Tenderness: There is no abdominal tenderness.  Musculoskeletal:     Cervical back: Neck supple.     Right lower leg: No edema.     Left lower leg: No edema.  Lymphadenopathy:     Cervical: No cervical adenopathy.  Skin:    Findings: No rash.  Neurological:     General: No focal deficit present.     Mental Status: He is alert and oriented to person, place, and time.  Psychiatric:        Mood and Affect: Mood normal.        Behavior: Behavior normal.            Assessment & Plan:

## 2019-12-07 NOTE — Assessment & Plan Note (Signed)
Fairly high No sig FH of vascular events Discussed primary prevention---not sure

## 2019-12-07 NOTE — Assessment & Plan Note (Signed)
Colon due 2029 Will monitor PSA with Dr Apolinar Junes Flu vaccine in the fall Td today Had COVID vaccine Working on fitness

## 2019-12-07 NOTE — Addendum Note (Signed)
Addended by: Erby Pian on: 12/07/2019 04:08 PM   Modules accepted: Orders

## 2019-12-08 LAB — COMPREHENSIVE METABOLIC PANEL
ALT: 26 U/L (ref 0–53)
AST: 21 U/L (ref 0–37)
Albumin: 4 g/dL (ref 3.5–5.2)
Alkaline Phosphatase: 55 U/L (ref 39–117)
BUN: 13 mg/dL (ref 6–23)
CO2: 33 mEq/L — ABNORMAL HIGH (ref 19–32)
Calcium: 9.3 mg/dL (ref 8.4–10.5)
Chloride: 106 mEq/L (ref 96–112)
Creatinine, Ser: 1.52 mg/dL — ABNORMAL HIGH (ref 0.40–1.50)
GFR: 56.77 mL/min — ABNORMAL LOW (ref >60.00)
Glucose, Bld: 83 mg/dL (ref 70–99)
Potassium: 3.8 mEq/L (ref 3.5–5.1)
Sodium: 142 mEq/L (ref 135–145)
Total Bilirubin: 0.5 mg/dL (ref 0.2–1.2)
Total Protein: 6.7 g/dL (ref 6.0–8.3)

## 2019-12-08 LAB — LIPID PANEL
Cholesterol: 230 mg/dL — ABNORMAL HIGH (ref 0–200)
HDL: 47.6 mg/dL (ref >39.00)
NonHDL: 182.15
Total CHOL/HDL Ratio: 5
Triglycerides: 296 mg/dL — ABNORMAL HIGH (ref 0.0–149.0)
VLDL: 59.2 mg/dL — ABNORMAL HIGH (ref 0.0–40.0)

## 2019-12-08 LAB — CBC
HCT: 39.7 % (ref 39.0–52.0)
Hemoglobin: 13.2 g/dL (ref 13.0–17.0)
MCHC: 33.3 g/dL (ref 30.0–36.0)
MCV: 87.5 fl (ref 78.0–100.0)
Platelets: 197 10*3/uL (ref 150.0–400.0)
RBC: 4.54 Mil/uL (ref 4.22–5.81)
RDW: 14.4 % (ref 11.5–15.5)
WBC: 4.6 10*3/uL (ref 4.0–10.5)

## 2019-12-08 LAB — LDL CHOLESTEROL, DIRECT: Direct LDL: 134 mg/dL

## 2020-03-25 ENCOUNTER — Other Ambulatory Visit: Payer: Self-pay

## 2020-03-25 MED ORDER — OXYCODONE-ACETAMINOPHEN 5-325 MG PO TABS: 1.0000 | ORAL_TABLET | Freq: Two times a day (BID) | ORAL | 0 refills | Status: DC | PRN

## 2020-03-25 NOTE — Telephone Encounter (Signed)
Name of Medication: oxycodone apap 5-325 mg Name of Pharmacy: [facility] Last Fill or Written Date and Quantity: # 60 on 10/05/19 Last Office Visit and Type: 12/07/19 annual Next Office Visit and Type: none scheduled Last Controlled Substance Agreement Date:08/20/2016  Last UDS:08/20/2016  Pt said he is out of medication.

## 2020-04-15 ENCOUNTER — Other Ambulatory Visit: Payer: Self-pay

## 2020-04-15 ENCOUNTER — Other Ambulatory Visit: Payer: BC Managed Care – PPO

## 2020-04-15 ENCOUNTER — Other Ambulatory Visit: Payer: Self-pay | Admitting: *Deleted

## 2020-04-15 DIAGNOSIS — R972 Elevated prostate specific antigen [PSA]: Secondary | ICD-10-CM

## 2020-04-16 LAB — PSA: Prostate Specific Ag, Serum: 8.3 ng/mL — ABNORMAL HIGH (ref 0.0–4.0)

## 2020-04-17 ENCOUNTER — Ambulatory Visit (INDEPENDENT_AMBULATORY_CARE_PROVIDER_SITE_OTHER): Payer: BC Managed Care – PPO | Admitting: Urology

## 2020-04-17 ENCOUNTER — Other Ambulatory Visit: Payer: Self-pay

## 2020-04-17 ENCOUNTER — Encounter: Payer: Self-pay | Admitting: Urology

## 2020-04-17 VITALS — BP 130/90 | HR 68

## 2020-04-17 DIAGNOSIS — N400 Enlarged prostate without lower urinary tract symptoms: Secondary | ICD-10-CM

## 2020-04-17 DIAGNOSIS — R972 Elevated prostate specific antigen [PSA]: Secondary | ICD-10-CM

## 2020-04-17 NOTE — Progress Notes (Signed)
04/17/2020 9:43 AM   Colton Miller 05-28-1960 [id number]  Referring provider: [name], MD [address] [place],  [place] [address]  Chief Complaint  Patient presents with  . Elevated PSA    HPI: 59 y.o. M with a personal history of elevated PSAwho returns today for 6 month f/u.  He was initially referred with elevated PSA of 6.1 on 08/2017. He underwent prostate biopsy on 11/30/2017 which showed no evidence of malignancy.TRUS vol 73 cc..  MR Prostate w wo contrast from 09/20/19 revealed3 cm peripheral zone nodule in the left posterolateral apex, suspicious high-grade carcinoma. PI-RADS 4: High (clinically significant cancer is likely to be present).4 cm transition zone nodule in the left anterior mid gland, suspicious for high-grade carcinoma. PI-RADS 4: High (clinically significant cancer is likely to be present) No evidence of extracapsular extension.  His fusion bx from 10/19/19 revealed few foci of high grade PIN but no malignancy.  He reports today that he was having worsening of his urinary symptoms over the past several months with some increased hesitancy, urgency and frequency.  He had stopped his saw palmetto but recently resumed this 2 weeks ago and his urinary symptoms are markedly improved.  He has no urinary complaints today.  He he is going to continue this regimen.  PSAtrend: 8.3  04/15/2020 7.0 08/15/2019 5.1 06/25/2018 6.1 08/26/2017 4.89 03/17/2017 3.73 09/2013   PMH: Past Medical History:  Diagnosis Date  . Femur fracture ([facility])   . Hyperlipidemia     Surgical History: Past Surgical History:  Procedure Laterality Date  . COLONOSCOPY WITH PROPOFOL N/A 06/17/2017   Procedure: COLONOSCOPY WITH PROPOFOL;  Surgeon: Wyline Mood, MD;  Location: [facility] ENDOSCOPY;  Service: Gastroenterology;  Laterality: N/A;  . LASIK     2004, then right redone 2008    Home Medications:  Allergies as of 04/17/2020   No Known Allergies      Medication List       Accurate as of April 17, 2020  9:43 AM. If you have any questions, ask your nurse or doctor.        STOP taking these medications   aspirin-acetaminophen-caffeine 250-250-65 MG tablet Commonly known as: EXCEDRIN MIGRAINE Stopped by: [name], MD     TAKE these medications   oxyCODONE-acetaminophen 5-325 MG tablet Commonly known as: PERCOCET/ROXICET Take 1 tablet by mouth 2 (two) times daily as needed. Dx Code M54.4 (Chronic Low Back Pain)   Saw Palmetto 1000 MG Caps Take by mouth.       Allergies: No Known Allergies  Family History: Family History  Problem Relation Age of Onset  . Cancer Paternal Grandmother        lung cancer  . Stroke Mother   . Heart disease Father   . Cancer Paternal Aunt   . Diabetes Neg Hx   . Coronary artery disease Neg Hx     Social History:  reports that he has never smoked. He has never used smokeless tobacco. He reports that he does not drink alcohol. No history on file for drug use.   Physical Exam: BP 130/90   Pulse 68   Constitutional:  Alert and oriented, No acute distress. HEENT: [place] AT, moist mucus membranes.  Trachea midline, no masses. Cardiovascular: No clubbing, cyanosis, or edema. Respiratory: Normal respiratory effort, no increased work of breathing. Skin: No rashes, bruises or suspicious lesions. Neurologic: Grossly intact, no focal deficits, moving all 4 extremities. Psychiatric: Normal mood and affect.   Assessment &  Plan:    1. Elevated PSA PSA continues to trend upwards, however he has had extensive evaluation including prostate MRI as well as fusion biopsy only 6 months ago  We had a frank discussion today regarding his rising PSA and differential diagnosis which includes prostatic inflammation, BPH as well as prostate cancer.  We will continue to follow him closely.  We will plan to repeat his PSA in 6 months as well as rectal exam.  If his PSA continues to rise at that point, we  will likely repeat his prostate MRI.  He is agreeable this plan.  2. BPH without obstruction/lower urinary tract symptoms Asymptomatic when taking saw palmetto, continue this medication   F/u 6 months PSA/ DRE  [name], MD  [facility] [address] [place], [place] [address] [phone]

## 2020-04-23 ENCOUNTER — Other Ambulatory Visit: Payer: Self-pay

## 2020-04-24 NOTE — Telephone Encounter (Signed)
The medication usually has to be sent in for a denial or more than 7 days supply PA is needed.

## 2020-04-24 NOTE — Telephone Encounter (Signed)
Find out what the pharmacy needs so they can give him 60 --which lasts for months. Apparently the chronic back pain code doesn't work?

## 2020-04-24 NOTE — Telephone Encounter (Signed)
Name of Medication: oxycodone apap 5-325 mg Name of [facility]acy[facility] Last Fill or Written Date and Quantity: # 60 on 03/25/20 Last Office Visit and Type: 12/07/19 annual Next Office Visit and Type: none scheduled Last Controlled Substance Agreement Date:08/20/2016  Last UDS:08/20/2016  Pt said insurance was requiring a PA to allow more than 7 days. States he only received a 7 day supply last month.  I placed the DX Code in the rx.

## 2020-04-24 NOTE — Telephone Encounter (Signed)
I tried to call the pharmacy and was on hold for 5 minutes. I had to hang up.

## 2020-04-24 NOTE — Telephone Encounter (Signed)
I did the PA and received this note: Information regarding your request Drug is covered by current benefit plan. No further PA activity needed. Calling pharmacy. Spoke to [place] and she said the prescription went through with no issues. Called pt and advised him. He never got the rx from last month so we do not need to send a new one.

## 2020-08-20 ENCOUNTER — Other Ambulatory Visit: Payer: Self-pay | Admitting: Family Medicine

## 2020-08-20 DIAGNOSIS — R972 Elevated prostate specific antigen [PSA]: Secondary | ICD-10-CM

## 2020-10-16 ENCOUNTER — Other Ambulatory Visit: Payer: Self-pay

## 2020-10-16 ENCOUNTER — Other Ambulatory Visit: Payer: BC Managed Care – PPO

## 2020-10-16 DIAGNOSIS — R972 Elevated prostate specific antigen [PSA]: Secondary | ICD-10-CM

## 2020-10-17 LAB — PSA: Prostate Specific Ag, Serum: 5.7 ng/mL — ABNORMAL HIGH (ref 0.0–4.0)

## 2020-10-22 ENCOUNTER — Ambulatory Visit (INDEPENDENT_AMBULATORY_CARE_PROVIDER_SITE_OTHER): Payer: BC Managed Care – PPO | Admitting: Urology

## 2020-10-22 ENCOUNTER — Encounter: Payer: Self-pay | Admitting: Urology

## 2020-10-22 ENCOUNTER — Other Ambulatory Visit: Payer: Self-pay

## 2020-10-22 VITALS — BP 145/92 | HR 68 | Ht 69.0 in | Wt 235.0 lb

## 2020-10-22 DIAGNOSIS — R972 Elevated prostate specific antigen [PSA]: Secondary | ICD-10-CM

## 2020-10-22 DIAGNOSIS — N400 Enlarged prostate without lower urinary tract symptoms: Secondary | ICD-10-CM

## 2020-10-22 NOTE — Progress Notes (Signed)
10/22/2020 10:25 AM   Colton Miller 02-Jun-1960 [id number]  Referring provider: [name], MD [address] [place],  [place] [address]  Chief Complaint  Patient presents with   Elevated PSA    HPI: 60 y.o. M with a personal history of elevated PSA who returns today for 6 month f/u, last seen 12/21/   He was initially referred with elevated PSA of 6.1 on 08/2017.  He underwent prostate biopsy on 11/30/2017 which showed no evidence of malignancy. TRUS vol 73 cc.  .    MR Prostate w wo contrast from 09/20/19 revealed 3 cm peripheral zone nodule in the left posterolateral apex, suspicious high-grade carcinoma. PI-RADS 4: High (clinically significant cancer is likely to be present). 4 cm transition zone nodule in the left anterior mid gland, suspicious for high-grade carcinoma. PI-RADS 4: High (clinically significant cancer is likely to be present) No evidence of extracapsular extension.   His fusion bx from 10/19/19 revealed few foci of high grade PIN but no malignancy.  Visit, he was having increased urinary symptoms including urgency and frequency.  This seems to have subsided and he is back to his previous baseline and doing well.  He still taking saw palmetto which he thinks is effective in controlling his urinary symptoms.  He denies any significant urgency frequency, straining, weak stream or incomplete emptying today.  He is very pleased with his symptoms.     PSA trend: 5.7 on 10/16/20 8.3  04/15/2020 7.0 08/15/2019 5.1 06/25/2018 6.1 08/26/2017 4.89 03/17/2017 3.73 09/2013   PMH: Past Medical History:  Diagnosis Date   Femur fracture (HCC)    Hyperlipidemia     Surgical History: Past Surgical History:  Procedure Laterality Date   COLONOSCOPY WITH PROPOFOL N/A 06/17/2017   Procedure: COLONOSCOPY WITH PROPOFOL;  Surgeon: Wyline Mood, MD;  Location: [facility] ENDOSCOPY;  Service: Gastroenterology;  Laterality: N/A;   LASIK     2004, then right redone 2008     Home Medications:  Allergies as of 10/22/2020   No Known Allergies      Medication List        Accurate as of October 22, 2020 10:25 AM. If you have any questions, ask your nurse or doctor.          STOP taking these medications    oxyCODONE-acetaminophen 5-325 MG tablet Commonly known as: PERCOCET/ROXICET Stopped by: [name], MD       TAKE these medications    Saw Palmetto 1000 MG Caps Take by mouth.        Allergies: No Known Allergies  Family History: Family History  Problem Relation Age of Onset   Cancer Paternal Grandmother        lung cancer   Stroke Mother    Heart disease Father    Cancer Paternal Aunt    Diabetes Neg Hx    Coronary artery disease Neg Hx     Social History:  reports that he has never smoked. He has never used smokeless tobacco. He reports that he does not drink alcohol. No history on file for drug use.   Physical Exam: BP (!) 145/92   Pulse 68   Ht 5\' 9"  (1.753 m)   Wt 235 lb (106.6 kg)   BMI 34.70 kg/m   Constitutional:  Alert and oriented, No acute distress. HEENT: [place] AT, moist mucus membranes.  Trachea midline, no masses. Cardiovascular: No clubbing, cyanosis, or edema. Respiratory: Normal respiratory effort, no increased work of breathing.  Rectal: Normal sphincter tone.  Enlarged prostate, 50+ grams with rubbery nodularity bilaterally without induration. Skin: No rashes, bruises or suspicious lesions. Neurologic: Grossly intact, no focal deficits, moving all 4 extremities. Psychiatric: Normal mood and affect.  Laboratory Data: Lab Results  Component Value Date   WBC 4.6 12/07/2019   HGB 13.2 12/07/2019   HCT 39.7 12/07/2019   MCV 87.5 12/07/2019   PLT 197.0 12/07/2019    Lab Results  Component Value Date   CREATININE 1.52 (H) 12/07/2019    Assessment & Plan:    1. Elevated PSA Personal history of elevated PSA some fluctuation  PSA has come down significantly today, previously more elevated  when he was having some irritative urinary symptoms ?  Inflammatory component  Rectal exam today is stable and unchanged from last year  At this point, given his relative stably elevated PSA with recent downward trend, reasonable to continue screening on a annual basis.  Follow-up in 1 year for PSA/D - PSA; Future  2. BPH without obstruction/lower urinary tract symptoms Symptoms now improved on saw palmetto, continue this medication and monitor symptoms    [name], MD  [facility] [address] [place], [place] [address] [phone]

## 2020-10-29 ENCOUNTER — Other Ambulatory Visit: Payer: Self-pay

## 2020-10-29 MED ORDER — OXYCODONE-ACETAMINOPHEN 5-325 MG PO TABS: 1.0000 | ORAL_TABLET | Freq: Two times a day (BID) | ORAL | 0 refills | Status: DC | PRN

## 2020-10-29 NOTE — Telephone Encounter (Signed)
Not on current med list.   Name of Medication: Oxycodone-APAP Name [facility] [place] or Written Date and Quantity: 03/25/20, #60 Last Office Visit and Type: 12/07/19, CPE Next Office Visit and Type: 11/05/20, R knee pain Last Controlled Substance Agreement Date: 08/20/16 Last UDS: 08/20/16

## 2020-11-05 ENCOUNTER — Other Ambulatory Visit: Payer: Self-pay

## 2020-11-05 ENCOUNTER — Encounter: Payer: Self-pay | Admitting: Internal Medicine

## 2020-11-05 ENCOUNTER — Ambulatory Visit (INDEPENDENT_AMBULATORY_CARE_PROVIDER_SITE_OTHER): Payer: BC Managed Care – PPO | Admitting: Internal Medicine

## 2020-11-05 VITALS — BP 118/80 | HR 80 | Temp 97.2°F | Ht 69.0 in | Wt 233.0 lb

## 2020-11-05 DIAGNOSIS — M25561 Pain in right knee: Secondary | ICD-10-CM | POA: Diagnosis not present

## 2020-11-05 NOTE — Assessment & Plan Note (Signed)
Could be MCL or meniscus injury Will likely need MRI and consideration for surgery

## 2020-11-05 NOTE — Progress Notes (Signed)
   Subjective:    Patient ID: Colton Miller, male    DOB: 12-14-1960, 60 y.o.   MRN: [id number]  HPI Here due to knee pain This visit occurred during the SARS-CoV-2 public health emergency.  Safety protocols were in place, including screening questions prior to the visit, additional usage of staff PPE, and extensive cleaning of exam room while observing appropriate contact time as indicated for disinfecting solutions.   Right knee hurting for 2-2.5 weeks Not sure why Will be sore in the AM--tried to walk it out Tends to worsen as the day goes on (and worse if on feet or walking) Sense of swelling--but doesn't look swollen Tender along MCL  Tried oxycodone --some help No NSAIDs  Did try elastic brace---not really helpful   Current Outpatient Medications on File Prior to Visit  Medication Sig Dispense Refill   oxyCODONE-acetaminophen (PERCOCET/ROXICET) 5-325 MG tablet Take 1 tablet by mouth 2 (two) times daily as needed. Dx Code M54.4 (Chronic Low Back Pain) 60 tablet 0   Saw Palmetto 1000 MG CAPS Take by mouth.     No current facility-administered medications on file prior to visit.    No Known Allergies  Past Medical History:  Diagnosis Date   Femur fracture (HCC)    Hyperlipidemia     Past Surgical History:  Procedure Laterality Date   COLONOSCOPY WITH PROPOFOL N/A 06/17/2017   Procedure: COLONOSCOPY WITH PROPOFOL;  Surgeon: Wyline Mood, MD;  Location: [facility] ENDOSCOPY;  Service: Gastroenterology;  Laterality: N/A;   LASIK     2004, then right redone 2008    Family History  Problem Relation Age of Onset   Cancer Paternal Grandmother        lung cancer   Stroke Mother    Heart disease Father    Cancer Paternal Aunt    Diabetes Neg Hx    Coronary artery disease Neg Hx     Social History   Socioeconomic History   Marital status: Married    Spouse name: Not on file   Number of children: 1   Years of education: Not on file   Highest education level: Not on file   Occupational History   Occupation: Psychologist, occupational    Comment: [employer]  Tobacco Use   Smoking status: Never   Smokeless tobacco: Never  Substance and Sexual Activity   Alcohol use: No   Drug use: Not on file   Sexual activity: Not on file  Other Topics Concern   Not on file  Social History Narrative   Not on file   Social Determinants of Health   Financial Resource Strain: Not on file  Food Insecurity: Not on file  Transportation Needs: Not on file  Physical Activity: Not on file  Stress: Not on file  Social Connections: Not on file  Intimate Partner Violence: Not on file   Review of Systems No other joint issues     Objective:   Physical Exam Constitutional:      Appearance: Normal appearance.  Musculoskeletal:     Comments: Right knee--no effusion ACL/PCL fine Tenderness along medial joint line and MacMurray's positive for medial stress  Neurological:     Mental Status: He is alert.     Comments: Severely antalgic gait           Assessment & Plan:

## 2020-11-13 ENCOUNTER — Ambulatory Visit (INDEPENDENT_AMBULATORY_CARE_PROVIDER_SITE_OTHER): Payer: BC Managed Care – PPO | Admitting: Orthopaedic Surgery

## 2020-11-13 ENCOUNTER — Other Ambulatory Visit: Payer: Self-pay

## 2020-11-13 ENCOUNTER — Encounter: Payer: Self-pay | Admitting: Orthopaedic Surgery

## 2020-11-13 ENCOUNTER — Ambulatory Visit (INDEPENDENT_AMBULATORY_CARE_PROVIDER_SITE_OTHER): Payer: BC Managed Care – PPO

## 2020-11-13 DIAGNOSIS — G8929 Other chronic pain: Secondary | ICD-10-CM | POA: Diagnosis not present

## 2020-11-13 DIAGNOSIS — M25561 Pain in right knee: Secondary | ICD-10-CM

## 2020-11-13 NOTE — Progress Notes (Signed)
Office Visit Note   Patient: Colton Miller           Date of Birth: 07/19/1960           MRN: [id number] Visit Date: 11/13/2020              Requested by: [name], MD [address] [place],  [place] [address] PCP: [name], MD   Assessment & Plan: Visit Diagnoses:  1. Chronic pain of right knee     Plan: based on assessment, I have a high suspicion for a medial meniscus tear.  He agreed to a cortisone injection today and will give this about 6 weeks for improvement.  He will follow up if he does not feel any relief at which point we would likely need to obtain an MRI  Follow-Up Instructions: Return if symptoms worsen or fail to improve.   Orders:  Orders Placed This Encounter  Procedures   XR KNEE 3 VIEW RIGHT   No orders of the defined types were placed in this encounter.     Procedures: Large Joint Inj: R knee on 11/13/2020 3:04 PM Indications: pain Details: 22 G needle  Arthrogram: No  Medications: 40 mg methylPREDNISolone acetate 40 MG/ML; 2 mL lidocaine 1 %; 2 mL bupivacaine 0.5 % Consent was given by the patient. Patient was prepped and draped in the usual sterile fashion.      Clinical Data: No additional findings.   Subjective: Chief Complaint  Patient presents with   Right Knee - Pain    Colton Miller is a very pleasant 60 year old male who comes in for evaluation of right knee pain of acute onset.  The pain is mainly on the medial side and is causing him to limp.  Hinged knee brace does not help the pain.  He feels a click in the knee.  He denies any injuries.  Takes oxycodone for chronic neck pain.  He has trouble walking due to the pain.     Review of Systems  Constitutional: Negative.   All other systems reviewed and are negative.   Objective: Vital Signs: There were no vitals taken for this visit.  Physical Exam Vitals and nursing note reviewed.  Constitutional:      Appearance: He is well-developed.  HENT:      Head: Normocephalic and atraumatic.  Eyes:     Pupils: Pupils are equal, round, and reactive to light.  Pulmonary:     Effort: Pulmonary effort is normal.  Abdominal:     Palpations: Abdomen is soft.  Musculoskeletal:        General: Normal range of motion.     Cervical back: Neck supple.  Skin:    General: Skin is warm.  Neurological:     Mental Status: He is alert and oriented to person, place, and time.  Psychiatric:        Behavior: Behavior normal.        Thought Content: Thought content normal.        Judgment: Judgment normal.    Ortho Exam Right knee shows no effusion.  Significant medial joint line tenderness and positive mcmurray's.  Collaterals and cruciates are intact.  Flexion past 90 degrees causes pain.   Specialty Comments:  No specialty comments available.  Imaging: XR KNEE 3 VIEW RIGHT  Result Date: 11/13/2020 No significant degenerative changes or joint space narrowing.    PMFS History: Patient Active Problem List   Diagnosis Date Noted  Right knee pain 11/05/2020   Prostatic intraepithelial neoplasia 11/16/2018   Low back pain 03/18/2017   Numbness of left hand 11/01/2015   Routine general medical examination at a health care facility 09/05/2010   BACK PAIN 09/17/2009   Hyperlipidemia 11/29/2007   Past Medical History:  Diagnosis Date   Femur fracture (HCC)    Hyperlipidemia     Family History  Problem Relation Age of Onset   Cancer Paternal Grandmother        lung cancer   Stroke Mother    Heart disease Father    Cancer Paternal Aunt    Diabetes Neg Hx    Coronary artery disease Neg Hx     Past Surgical History:  Procedure Laterality Date   COLONOSCOPY WITH PROPOFOL N/A 06/17/2017   Procedure: COLONOSCOPY WITH PROPOFOL;  Surgeon: Wyline Mood, MD;  Location: [facility] ENDOSCOPY;  Service: Gastroenterology;  Laterality: N/A;   LASIK     2004, then right redone 2008   Social History   Occupational History   Occupation: Psychologist, occupational     Comment: [employer]  Tobacco Use   Smoking status: Never   Smokeless tobacco: Never  Substance and Sexual Activity   Alcohol use: No   Drug use: Not on file   Sexual activity: Not on file

## 2020-11-14 MED ORDER — LIDOCAINE HCL 1 % IJ SOLN
2.0000 mL | INTRAMUSCULAR | Status: AC | PRN
  Administered 2020-11-13: 2 mL

## 2020-11-14 MED ORDER — METHYLPREDNISOLONE ACETATE 40 MG/ML IJ SUSP
40.0000 mg | INTRAMUSCULAR | Status: AC | PRN
  Administered 2020-11-13: 40 mg via INTRA_ARTICULAR

## 2020-11-14 MED ORDER — BUPIVACAINE HCL 0.5 % IJ SOLN
2.0000 mL | INTRAMUSCULAR | Status: AC | PRN
  Administered 2020-11-13: 2 mL via INTRA_ARTICULAR

## 2021-01-01 ENCOUNTER — Other Ambulatory Visit: Payer: Self-pay

## 2021-01-01 ENCOUNTER — Ambulatory Visit (INDEPENDENT_AMBULATORY_CARE_PROVIDER_SITE_OTHER): Payer: BC Managed Care – PPO | Admitting: Internal Medicine

## 2021-01-01 ENCOUNTER — Encounter: Payer: Self-pay | Admitting: Internal Medicine

## 2021-01-01 VITALS — BP 122/84 | HR 68 | Temp 97.5°F | Ht 69.0 in | Wt 227.0 lb

## 2021-01-01 DIAGNOSIS — Z0000 Encounter for general adult medical examination without abnormal findings: Secondary | ICD-10-CM

## 2021-01-01 DIAGNOSIS — Z23 Encounter for immunization: Secondary | ICD-10-CM

## 2021-01-01 DIAGNOSIS — E785 Hyperlipidemia, unspecified: Secondary | ICD-10-CM

## 2021-01-01 LAB — COMPREHENSIVE METABOLIC PANEL
ALT: 27 U/L (ref 0–53)
AST: 26 U/L (ref 0–37)
Albumin: 4.3 g/dL (ref 3.5–5.2)
Alkaline Phosphatase: 57 U/L (ref 39–117)
BUN: 15 mg/dL (ref 6–23)
CO2: 28 mEq/L (ref 19–32)
Calcium: 9.9 mg/dL (ref 8.4–10.5)
Chloride: 104 mEq/L (ref 96–112)
Creatinine, Ser: 1.36 mg/dL (ref 0.40–1.50)
GFR: 56.09 mL/min — ABNORMAL LOW (ref >60.00)
Glucose, Bld: 84 mg/dL (ref 70–99)
Potassium: 3.8 mEq/L (ref 3.5–5.1)
Sodium: 138 mEq/L (ref 135–145)
Total Bilirubin: 0.5 mg/dL (ref 0.2–1.2)
Total Protein: 7.6 g/dL (ref 6.0–8.3)

## 2021-01-01 LAB — CBC
HCT: 42.2 % (ref 39.0–52.0)
Hemoglobin: 14.3 g/dL (ref 13.0–17.0)
MCHC: 33.8 g/dL (ref 30.0–36.0)
MCV: 87.2 fl (ref 78.0–100.0)
Platelets: 206 10*3/uL (ref 150.0–400.0)
RBC: 4.84 Mil/uL (ref 4.22–5.81)
RDW: 14.6 % (ref 11.5–15.5)
WBC: 3.8 10*3/uL — ABNORMAL LOW (ref 4.0–10.5)

## 2021-01-01 LAB — LDL CHOLESTEROL, DIRECT: Direct LDL: 155 mg/dL

## 2021-01-01 LAB — LIPID PANEL
Cholesterol: 287 mg/dL — ABNORMAL HIGH (ref 0–200)
HDL: 42.2 mg/dL (ref >39.00)
Total CHOL/HDL Ratio: 7
Triglycerides: 505 mg/dL — ABNORMAL HIGH (ref 0.0–149.0)

## 2021-01-01 NOTE — Progress Notes (Signed)
Subjective:    Patient ID: Colton Miller, male    DOB: 10/20/1960, 60 y.o.   MRN: [id number]  HPI Here for physical This visit occurred during the SARS-CoV-2 public health emergency.  Safety protocols were in place, including screening questions prior to the visit, additional usage of staff PPE, and extensive cleaning of exam room while observing appropriate contact time as indicated for disinfecting solutions.   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 Dx Code M54.4 (Chronic Low Back Pain) 60 tablet 0   Saw Palmetto 1000 MG CAPS Take by mouth.     No current facility-administered medications on file prior to visit.    No Known Allergies  Past Medical History:  Diagnosis Date   Femur fracture (HCC)    Hyperlipidemia     Past Surgical History:  Procedure Laterality Date   COLONOSCOPY WITH PROPOFOL N/A 06/17/2017   Procedure: COLONOSCOPY WITH PROPOFOL;  Surgeon: Wyline Mood, MD;  Location: [facility] ENDOSCOPY;  Service: Gastroenterology;  Laterality: N/A;   LASIK     2004, then right redone 2008    Family History  Problem Relation Age of Onset   Cancer Paternal Grandmother        lung cancer   Stroke Mother    Heart disease Father    Cancer Paternal Aunt    Diabetes Neg Hx    Coronary artery disease Neg Hx     Social History   Socioeconomic History   Marital status: Married    Spouse name: Not on file   Number of children: 1   Years of education: Not on file   Highest education  level: Not on file  Occupational History   Occupation: Psychologist, occupational    Comment: [employer]  Tobacco Use   Smoking status: Never   Smokeless tobacco: Never  Substance and Sexual Activity   Alcohol use: No   Drug use: Not on file   Sexual activity: Not on file  Other Topics Concern   Not on file  Social History Narrative   Not on file   Social Determinants of Health   Financial Resource Strain: Not on file  Food Insecurity: Not on file  Transportation Needs: Not on file  Physical Activity: Not on file  Stress: Not on file  Social Connections: Not on file  Intimate Partner Violence: Not on file   Review of Systems  Constitutional:  Negative for fatigue and unexpected weight change.       Doing some exercise--mostly at home Wears seat belt  HENT:  Negative for dental problem, hearing loss and tinnitus.        Keeps up with dentist  Eyes:        No diplopia or unilateral vision loss Occasional blurry vision---related to phone use  Respiratory:  Negative for cough, chest tightness and shortness of breath.   Cardiovascular:  Negative for palpitations and leg swelling.       Gets some left pectoral pain--if he moves a certain way  Gastrointestinal:  Negative for blood  in stool.       Stools tend to be soft No heartburn  Endocrine: Negative for polydipsia and polyuria.  Genitourinary:        Nocturia x 1-2 Slightly slow flow----still on saw palmetto (can tell if he misses doses) No sexual problems  Musculoskeletal:  Positive for arthralgias. Negative for back pain and joint swelling.  Skin:  Negative for rash.       No suspicious lesions  Allergic/Immunologic: Negative for environmental allergies and immunocompromised state.  Neurological:  Negative for dizziness, syncope and light-headedness.       Some tension headaches  Hematological:  Negative for adenopathy. Does not bruise/bleed easily.  Psychiatric/Behavioral:  Negative for dysphoric mood and sleep disturbance.  The patient is not nervous/anxious.       Objective:   Physical Exam Constitutional:      Appearance: Normal appearance.  HENT:     Right Ear: Tympanic membrane and ear canal normal.     Left Ear: Tympanic membrane and ear canal normal.     Mouth/Throat:     Pharynx: No oropharyngeal exudate or posterior oropharyngeal erythema.  Eyes:     Conjunctiva/sclera: Conjunctivae normal.     Pupils: Pupils are equal, round, and reactive to light.  Cardiovascular:     Rate and Rhythm: Normal rate and regular rhythm.     Pulses: Normal pulses.     Heart sounds: No murmur heard.   No gallop.  Pulmonary:     Effort: Pulmonary effort is normal.     Breath sounds: Normal breath sounds. No wheezing or rales.  Abdominal:     Palpations: Abdomen is soft.     Tenderness: There is no abdominal tenderness.  Musculoskeletal:     Cervical back: Neck supple.     Right lower leg: No edema.     Left lower leg: No edema.  Lymphadenopathy:     Cervical: No cervical adenopathy.  Skin:    General: Skin is warm.     Findings: No rash.  Neurological:     General: No focal deficit present.     Mental Status: He is alert and oriented to person, place, and time.  Psychiatric:        Mood and Affect: Mood normal.        Behavior: Behavior normal.           Assessment & Plan:

## 2021-01-01 NOTE — Assessment & Plan Note (Signed)
Discussed statin--he prefers to work on lifestyle

## 2021-01-01 NOTE — Addendum Note (Signed)
Addended by: Nyra Capes on: 01/01/2021 09:44 AM   Modules accepted: Orders

## 2021-01-01 NOTE — Assessment & Plan Note (Signed)
Healthy Discussed fitness He will get flu and bivalent COVID later Will give shingrix Colon 2029 Gets PSA from urologist

## 2021-09-22 ENCOUNTER — Other Ambulatory Visit: Payer: Self-pay

## 2021-09-22 MED ORDER — OXYCODONE-ACETAMINOPHEN 5-325 MG PO TABS: 1.0000 | ORAL_TABLET | Freq: Two times a day (BID) | ORAL | 0 refills | Status: DC | PRN

## 2021-09-22 NOTE — Telephone Encounter (Signed)
Name of Medication: Oxycodone-APAP Name of Pharmacy: [facility] Last [place] or Written Date and Quantity: 10/29/20, #60 Last Office Visit and Type: 01/01/21 CPE Next Office Visit and Type: 09/26/21 R knee pain Last Controlled Substance Agreement Date: 08/20/16 Last UDS: 08/20/16

## 2021-09-26 ENCOUNTER — Ambulatory Visit: Payer: BC Managed Care – PPO | Admitting: Internal Medicine

## 2021-10-01 ENCOUNTER — Encounter: Payer: Self-pay | Admitting: Internal Medicine

## 2021-10-01 ENCOUNTER — Ambulatory Visit: Payer: BC Managed Care – PPO | Admitting: Internal Medicine

## 2021-10-01 DIAGNOSIS — M23203 Derangement of unspecified medial meniscus due to old tear or injury, right knee: Secondary | ICD-10-CM | POA: Diagnosis not present

## 2021-10-01 NOTE — Patient Instructions (Signed)
Please continue tylenol arthritis 650mg  three times a day. Try over the counter diclofenac gel three times a day also on the painful area. Call Dr for a follow up appt

## 2021-10-01 NOTE — Progress Notes (Signed)
   Subjective:    Patient ID: Colton Miller, male    DOB: 1960-12-09, 61 y.o.   MRN: [id number]  HPI Here due to ongoing right knee pain  Has had pain that goes back a year or so Saw ortho--x-ray okay and got cortisone injection (which didn't help) Has swollen feeling---worse in the morning Improves through the day--but never goes away Not as bad at times  Wearing knee brace---just straps This does help  Uses the oxycodone rarely Ibuprofen 400mg  and tylenol arthritis intermittently (tylenol does ease some but not the ibuprofen)  Not restricted in activity--but doesn't do yard work No regular exercise--because of the knee (walks briefly) More pain when coming down stairs  Current Outpatient Medications on File Prior to Visit  Medication Sig Dispense Refill   oxyCODONE-acetaminophen (PERCOCET/ROXICET) 5-325 MG tablet Take 1 tablet by mouth 2 (two) times daily as needed. Dx Code M54.4 (Chronic Low Back Pain) 60 tablet 0   Saw Palmetto 1000 MG CAPS Take by mouth.     No current facility-administered medications on file prior to visit.    No Known Allergies  Past Medical History:  Diagnosis Date   Femur fracture (HCC)    Hyperlipidemia     Past Surgical History:  Procedure Laterality Date   COLONOSCOPY WITH PROPOFOL N/A 06/17/2017   Procedure: COLONOSCOPY WITH PROPOFOL;  Surgeon: 06/19/2017, MD;  Location: [facility] ENDOSCOPY;  Service: Gastroenterology;  Laterality: N/A;   LASIK     2004, then right redone 2008    Family History  Problem Relation Age of Onset   Cancer Paternal Grandmother        lung cancer   Stroke Mother    Heart disease Father    Cancer Paternal Aunt    Diabetes Neg Hx    Coronary artery disease Neg Hx     Social History   Socioeconomic History   Marital status: Married    Spouse name: Not on file   Number of children: 1   Years of education: Not on file   Highest education level: Not on file  Occupational History   Occupation: 2009     Comment: [employer], occupational  Tobacco Use   Smoking status: Never    Passive exposure: Past   Smokeless tobacco: Never  Substance and Sexual Activity   Alcohol use: No   Drug use: Not on file   Sexual activity: Not on file  Other Topics Concern   Not on file  Social History Narrative   Not on file   Social Determinants of Health   Financial Resource Strain: Not on file  Food Insecurity: Not on file  Transportation Needs: Not on file  Physical Activity: Not on file  Stress: Not on file  Social Connections: Not on file  Intimate Partner Violence: Not on file   Review of Systems No fevers No other joint issues     Objective:   Physical Exam Constitutional:      Appearance: Normal appearance.  Musculoskeletal:     Comments: No right knee swelling or effusion Limits full extension No ligament instability Positive McMurray's for medial meniscus  Neurological:     Mental Status: He is alert.     Comments: Antalgic gait           Assessment & Plan:

## 2021-10-01 NOTE — Assessment & Plan Note (Signed)
Similar physical findings to exam by Dr Erlinda Hong almost a year ago Really suffering and limited in activity Will set back up with Dr Xu----will almost certainly need arthroscopy Discussed trying OTC diclofenac gel

## 2021-10-03 NOTE — Progress Notes (Signed)
Appointment made

## 2021-10-06 ENCOUNTER — Telehealth: Payer: Self-pay

## 2021-10-06 NOTE — Telephone Encounter (Signed)
PA Completed on CoverMyMeds. Waiting for response. Can take 24-72 hours

## 2021-10-09 ENCOUNTER — Ambulatory Visit (INDEPENDENT_AMBULATORY_CARE_PROVIDER_SITE_OTHER): Payer: BC Managed Care – PPO

## 2021-10-09 ENCOUNTER — Encounter: Payer: Self-pay | Admitting: Orthopaedic Surgery

## 2021-10-09 ENCOUNTER — Other Ambulatory Visit: Payer: Self-pay

## 2021-10-09 ENCOUNTER — Ambulatory Visit: Payer: BC Managed Care – PPO | Admitting: Orthopaedic Surgery

## 2021-10-09 DIAGNOSIS — M25561 Pain in right knee: Secondary | ICD-10-CM | POA: Diagnosis not present

## 2021-10-09 DIAGNOSIS — G8929 Other chronic pain: Secondary | ICD-10-CM | POA: Diagnosis not present

## 2021-10-09 NOTE — Progress Notes (Signed)
   Office Visit Note   Patient: Colton Miller           Date of Birth: 04/12/1961           MRN: [id number] Visit Date: 10/09/2021              Requested by: Venia Carbon, MD [place],  [place] [address] PCP: Venia Carbon, MD   Assessment & Plan: Visit Diagnoses:  1. Chronic pain of right knee     Plan: Impression is chronic right knee pain concerning for medial meniscus tear.  This has not gotten better over the last year or with a cortisone injection therefore we will need an MRI to assess for medial meniscal tear.  Follow-up after the MRI.  Follow-Up Instructions: No follow-ups on file.   Orders:  Orders Placed This Encounter  Procedures   XR KNEE 3 VIEW RIGHT   No orders of the defined types were placed in this encounter.     Procedures: No procedures performed   Clinical Data: No additional findings.   Subjective: Chief Complaint  Patient presents with   Right Knee - Pain    HPI Mr. Carona returns today for continued right knee pain.  We saw him about a year ago and did a cortisone injection unfortunately the injection never helped his symptoms.  He was hoping that it would get better.  Currently wears a knee strap.  He is also changed jobs in the meantime which disrupted his health insurance.  Continues to have medial sided knee pain that burns as well.  Review of Systems   Objective: Vital Signs: There were no vitals taken for this visit.  Physical Exam  Ortho Exam Examination of right knee shows no joint effusion.  Significant medial joint tenderness.  Pain with McMurray test. Specialty Comments:  No specialty comments available.  Imaging: No results found.   PMFS History: Patient Active Problem List   Diagnosis Date Noted   Old tear of medial meniscus of right knee 10/01/2021   Right knee pain 11/05/2020   Prostatic intraepithelial neoplasia 11/16/2018   Low back pain 03/18/2017   Numbness of left hand  11/01/2015   Routine general medical examination at a health care facility 09/05/2010   BACK PAIN 09/17/2009   Hyperlipidemia 11/29/2007   Past Medical History:  Diagnosis Date   Femur fracture ([place])    Hyperlipidemia     Family History  Problem Relation Age of Onset   Cancer Paternal Grandmother        lung cancer   Stroke Mother    Heart disease Father    Cancer Paternal Aunt    Diabetes Neg Hx    Coronary artery disease Neg Hx     Past Surgical History:  Procedure Laterality Date   COLONOSCOPY WITH PROPOFOL N/A 06/17/2017   Procedure: COLONOSCOPY WITH PROPOFOL;  Surgeon: Jonathon Bellows, MD;  Location: [facility] ENDOSCOPY;  Service: Gastroenterology;  Laterality: N/A;   LASIK     2004, then right redone 2008   Social History   Occupational History   Occupation: Customer service manager    Comment: [employer]  Tobacco Use   Smoking status: Never    Passive exposure: Past   Smokeless tobacco: Never  Substance and Sexual Activity   Alcohol use: No   Drug use: Not on file   Sexual activity: Not on file

## 2021-10-10 NOTE — Telephone Encounter (Signed)
Pt called to follow up this, advised we were still waiting on PA

## 2021-10-13 NOTE — Telephone Encounter (Signed)
Left message on VM that CoverMyMeds states it was denied. I was out of the office last week so I am not aware if the denial was faxed to [place] as I have not been through my paperwork.

## 2021-10-13 NOTE — Telephone Encounter (Signed)
Patient called back reviewed message. Will go to pick up 7 day and let us know if any problems.

## 2021-10-15 ENCOUNTER — Telehealth: Payer: Self-pay | Admitting: Internal Medicine

## 2021-10-15 MED ORDER — OXYCODONE-ACETAMINOPHEN 5-325 MG PO TABS: 1.0000 | ORAL_TABLET | Freq: Two times a day (BID) | ORAL | 0 refills | Status: DC | PRN

## 2021-10-15 NOTE — Addendum Note (Signed)
Addended by: Tillman Abide I on: 10/15/2021 01:21 PM   Modules accepted: Orders

## 2021-10-15 NOTE — Telephone Encounter (Signed)
Please let him know I sent it to [facility]

## 2021-10-15 NOTE — Telephone Encounter (Signed)
Misty Stanley from [facility] pharmacy called for the patient about a medication that needs to be changed so they can fill it, she did not say the name of the medication, she wants to speak to the nurse about this. Please advise, thanks.  Callback Number (Pharmacy): [phone]

## 2021-10-15 NOTE — Telephone Encounter (Signed)
Spoke to [employer] Scientist, research (physical sciences)) by telephone and was advised that Dr. Alphonsus Sias sent over a script for Percocet. Misty Stanley said that patient is new to them and it appears that patient has not had this medication in over a year. Misty Stanley stated the most that she can fill this time would be a 7 day supply. Misty Stanley stated that a new script can be sent over or Dr. Alphonsus Sias can call and discuss this with her.

## 2021-10-15 NOTE — Telephone Encounter (Signed)
Patient notified as instructed by telephone and verbalized understanding. 

## 2021-10-15 NOTE — Telephone Encounter (Signed)
Spoke to pt. PA was denied as this is a non-covered medication on his plan. While talking to him I looked at the [employer]. $15.50 at [employer]. Pt is asking if the rx can be sent to [facility] in [place] and he will use a Good Rx Card.

## 2021-10-15 NOTE — Addendum Note (Signed)
Addended by: Eual Fines on: 10/15/2021 08:39 AM   Modules accepted: Orders

## 2021-10-16 MED ORDER — OXYCODONE-ACETAMINOPHEN 5-325 MG PO TABS: 1.0000 | ORAL_TABLET | Freq: Four times a day (QID) | ORAL | 0 refills | Status: DC | PRN

## 2021-10-16 NOTE — Telephone Encounter (Signed)
New Rx sent--different sig. 5 day supply

## 2021-10-16 NOTE — Addendum Note (Signed)
Addended by: Tillman Abide I on: 10/16/2021 12:45 PM   Modules accepted: Orders

## 2021-10-17 ENCOUNTER — Other Ambulatory Visit: Payer: Self-pay

## 2021-10-17 DIAGNOSIS — R972 Elevated prostate specific antigen [PSA]: Secondary | ICD-10-CM | POA: Diagnosis not present

## 2021-10-18 LAB — PSA: Prostate Specific Ag, Serum: 7.1 ng/mL — ABNORMAL HIGH (ref 0.0–4.0)

## 2021-10-20 ENCOUNTER — Ambulatory Visit
Admission: RE | Admit: 2021-10-20 | Discharge: 2021-10-20 | Disposition: A | Discharge status: Home / Self Care | Payer: BC Managed Care – PPO | Source: Ambulatory Visit | Attending: Orthopaedic Surgery | Admitting: Orthopaedic Surgery

## 2021-10-20 DIAGNOSIS — S83241A Other tear of medial meniscus, current injury, right knee, initial encounter: Secondary | ICD-10-CM | POA: Diagnosis not present

## 2021-10-20 DIAGNOSIS — G8929 Other chronic pain: Secondary | ICD-10-CM

## 2021-10-22 ENCOUNTER — Ambulatory Visit: Payer: Self-pay | Admitting: Urology

## 2021-10-22 NOTE — Progress Notes (Incomplete)
10/22/21 6:09 AM   Colton Miller July 09, 1960 [id number]  Referring provider:  [name], MD [address] [place],  [place] [address] No chief complaint on file.     HPI: Colton Miller is a 61 y.o.male with a personal history of elevated PSA and BPH without obstruction/LUTS, who presents today for a 1 year follow-up with PSA, IPSS and DRE  He underwent prostate biopsy on 11/30/2017 which showed no evidence of malignancy. TRUS vol 73 cc.  .    MR Prostate w wo contrast from 09/20/19 revealed 3 cm peripheral zone nodule in the left posterolateral apex, suspicious high-grade carcinoma. PI-RADS 4: High (clinically significant cancer is likely to be present). 4 cm transition zone nodule in the left anterior mid gland, suspicious for high-grade carcinoma. PI-RADS 4: High (clinically significant cancer is likely to be present) No evidence of extracapsular extension.   His fusion bx from 10/19/19 revealed few foci of high grade PIN but no malignancy.  His most recent PSA on 10/17/2021 was 7.1. PSA trend below.   PSA trend: 7.1 on 10/22/21 5.7 on 10/16/20 8.3  04/15/2020 7.0 08/15/2019 5.1 06/25/2018 6.1 08/26/2017 4.89 03/17/2017 3.73 09/2013     PMH: Past Medical History:  Diagnosis Date   Femur fracture (HCC)    Hyperlipidemia     Surgical History: Past Surgical History:  Procedure Laterality Date   COLONOSCOPY WITH PROPOFOL N/A 06/17/2017   Procedure: COLONOSCOPY WITH PROPOFOL;  Surgeon: Wyline Mood, MD;  Location: [facility] ENDOSCOPY;  Service: Gastroenterology;  Laterality: N/A;   LASIK     2004, then right redone 2008    Home Medications:  Allergies as of 10/22/2021   No Known Allergies      Medication List        Accurate as of October 22, 2021  6:09 AM. If you have any questions, ask your nurse or doctor.          oxyCODONE-acetaminophen 5-325 MG tablet Commonly known as: PERCOCET/ROXICET Take 1-2 tablets by mouth every 6 (six) hours as needed. Dx  Code M54.4 (Chronic Low Back Pain)   Saw Palmetto 1000 MG Caps Take by mouth.        Allergies:  No Known Allergies  Family History: Family History  Problem Relation Age of Onset   Cancer Paternal Grandmother        lung cancer   Stroke Mother    Heart disease Father    Cancer Paternal Aunt    Diabetes Neg Hx    Coronary artery disease Neg Hx     Social History:  reports that he has never smoked. He has been exposed to tobacco smoke. He has never used smokeless tobacco. He reports that he does not drink alcohol. No history on file for drug use.   Physical Exam: There were no vitals taken for this visit.  Constitutional:  Alert and oriented, No acute distress. HEENT:  AT, moist mucus membranes.  Trachea midline, no masses. Cardiovascular: No clubbing, cyanosis, or edema. Respiratory: Normal respiratory effort, no increased work of breathing. Skin: No rashes, bruises or suspicious lesions. Neurologic: Grossly intact, no focal deficits, moving all 4 extremities. Psychiatric: Normal mood and affect.  Laboratory Data:  Lab Results  Component Value Date   CREATININE 1.36 01/01/2021   No results found for: "HGBA1C"  Urinalysis   Pertinent Imaging:    Assessment & Plan:     No follow-ups on file.  I,Kailey Littlejohn,acting as a Neurosurgeon for [employer]  Apolinar Junes, MD.,have documented all relevant documentation on the behalf of [name], MD,as directed by  [name], MD while in the presence of [name], MD.   [facility] [address], [address] [place], [place] [address] [phone]

## 2021-10-24 ENCOUNTER — Encounter: Payer: Self-pay | Admitting: Urology

## 2021-10-24 ENCOUNTER — Ambulatory Visit: Payer: BC Managed Care – PPO | Admitting: Orthopaedic Surgery

## 2021-11-05 ENCOUNTER — Telehealth (INDEPENDENT_AMBULATORY_CARE_PROVIDER_SITE_OTHER): Payer: BC Managed Care – PPO | Admitting: Orthopaedic Surgery

## 2021-11-05 DIAGNOSIS — S83241A Other tear of medial meniscus, current injury, right knee, initial encounter: Secondary | ICD-10-CM | POA: Insufficient documentation

## 2021-11-05 NOTE — Telephone Encounter (Signed)
Colton Miller wants to know if his MRI results appt can be changed to televisit....pleased advise

## 2021-11-05 NOTE — Telephone Encounter (Signed)
Called patient today

## 2021-11-05 NOTE — Telephone Encounter (Signed)
I called the patient today to go over the MRI scan which does show a complex tear of the medial meniscus.  Patient reports continued symptoms consistent with a meniscus tear and this has been going on for over a year despite conservative management.  Treatment options were reviewed in detail including knee arthroscopy versus continue conservative management based on his options he has elected to move forward with a knee arthroscopy surgery.  Risk benefits rehab recovery reviewed in detail.  All questions answered to his satisfaction.  Colton Miller will call the patient to schedule surgery.

## 2021-11-11 ENCOUNTER — Ambulatory Visit: Payer: BC Managed Care – PPO | Admitting: Orthopaedic Surgery

## 2021-11-12 ENCOUNTER — Telehealth: Payer: Self-pay | Admitting: Orthopaedic Surgery

## 2021-11-12 NOTE — Telephone Encounter (Signed)
Patient is scheduled for right knee partial medial [facility]ce[facility] 11-27-21. Post operative appointment is set for 12-05-21.  I did discuss with the patient estimated time out of work, but he states he would feel better if he could speak with the doctor directly about this limitations after the surgery.  He is basically trying to determine if filing "short term disability" would be in his best interest.  Patient can be reached at [phone].

## 2021-11-13 NOTE — Telephone Encounter (Signed)
Left voice mail

## 2021-11-19 ENCOUNTER — Ambulatory Visit: Payer: Self-pay | Admitting: Urology

## 2021-11-20 ENCOUNTER — Telehealth: Payer: Self-pay | Admitting: Orthopaedic Surgery

## 2021-11-20 NOTE — Telephone Encounter (Signed)
Called patient to let him know his surgery time had moved on 11-27-21 at [facility].  He said he needed to cancel for Thursday because his wife is in the hospital .  Patient said he would call to reschedule when things settle down.  He has my name and direct number to reschedule his right knee scope.

## 2021-11-24 ENCOUNTER — Encounter: Payer: Self-pay | Admitting: Internal Medicine

## 2021-11-25 ENCOUNTER — Ambulatory Visit: Payer: BC Managed Care – PPO | Admitting: Urology

## 2021-11-25 VITALS — BP 137/91 | HR 99 | Ht 69.0 in | Wt 210.0 lb

## 2021-11-25 DIAGNOSIS — R972 Elevated prostate specific antigen [PSA]: Secondary | ICD-10-CM | POA: Diagnosis not present

## 2021-11-25 DIAGNOSIS — N401 Enlarged prostate with lower urinary tract symptoms: Secondary | ICD-10-CM

## 2021-11-25 DIAGNOSIS — N400 Enlarged prostate without lower urinary tract symptoms: Secondary | ICD-10-CM

## 2021-11-25 NOTE — Progress Notes (Signed)
11/25/21 2:40 PM   Vallie Teters Greear June 16, 1960 [id number]  Referring provider:  [name], MD [address] [place],  [place] [address] Chief Complaint  Patient presents with   Elevated PSA      HPI: Colton Miller is a 61 y.o.male with a personal history of elevated PSA and BPH  personal history of who presents today for a 1 year follow-up with IPSS, PSA, and DRE.   He was initially referred with elevated PSA of 6.1 on 08/2017.  He underwent prostate biopsy on 11/30/2017 which showed no evidence of malignancy. TRUS vol 73 cc.  .    MR Prostate w wo contrast from 09/20/19 revealed 3 cm peripheral zone nodule in the left posterolateral apex, suspicious high-grade carcinoma. PI-RADS 4: High (clinically significant cancer is likely to be present). 4 cm transition zone nodule in the left anterior mid gland, suspicious for high-grade carcinoma. PI-RADS 4: High (clinically significant cancer is likely to be present) No evidence of extracapsular extension.  His fusion bx from 10/19/19 revealed few foci of high grade PIN but no malignancy.  His most recent PSA was 7.1 on 10/17/2021  He reports that he increased his saw palmetto to twice daily. He had weak urinary stream and pressure and his symptoms improved when he increased dose.  IPSS 7.   He reports he thinks his diet is exercising more.  He has lost more than 10 pounds and doing this and feels better overall.  He does have several social stressors including some issues with his wife's behavior/emotional wellbeing.   IPSS     Row Name 11/25/21 1400         International Prostate Symptom Score   How often have you had the sensation of not emptying your bladder? Less than 1 in 5     How often have you had to urinate less than every two hours? Less than half the time     How often have you found you stopped and started again several times when you urinated? Not at All     How often have you found it difficult to postpone  urination? Less than half the time     How often have you had a weak urinary stream? Less than 1 in 5 times     How often have you had to strain to start urination? Not at All     How many times did you typically get up at night to urinate? 1 Time     Total IPSS Score 7       Quality of Life due to urinary symptoms   If you were to spend the rest of your life with your urinary condition just the way it is now how would you feel about that? Mostly Satisfied              Score:  1-7 Mild 8-19 Moderate 20-35 Severe   PSA trend:  7.1 on 10/17/21 5.7 on 10/16/20 8.3  04/15/2020 7.0 08/15/2019 5.1 06/25/2018 6.1 08/26/2017 4.89 03/17/2017 3.73 09/2013  PMH: Past Medical History:  Diagnosis Date   Femur fracture (HCC)    Hyperlipidemia     Surgical History: Past Surgical History:  Procedure Laterality Date   COLONOSCOPY WITH PROPOFOL N/A 06/17/2017   Procedure: COLONOSCOPY WITH PROPOFOL;  Surgeon: Wyline Mood, MD;  Location: [facility] ENDOSCOPY;  Service: Gastroenterology;  Laterality: N/A;   LASIK     2004, then right redone 2008    Home  Medications:  Allergies as of 11/25/2021   No Known Allergies      Medication List        Accurate as of November 25, 2021  2:40 PM. If you have any questions, ask your nurse or doctor.          oxyCODONE-acetaminophen 5-325 MG tablet Commonly known as: PERCOCET/ROXICET Take 1-2 tablets by mouth every 6 (six) hours as needed. Dx Code M54.4 (Chronic Low Back Pain)   Saw Palmetto 1000 MG Caps Take by mouth.        Allergies:  No Known Allergies  Family History: Family History  Problem Relation Age of Onset   Cancer Paternal Grandmother        lung cancer   Stroke Mother    Heart disease Father    Cancer Paternal Aunt    Diabetes Neg Hx    Coronary artery disease Neg Hx     Social History:  reports that he has never smoked. He has been exposed to tobacco smoke. He has never used smokeless tobacco. He reports that he does  not drink alcohol. No history on file for drug use.   Physical Exam: BP (!) 137/91   Pulse 99   Ht 5\' 9"  (1.753 m)   Wt 210 lb (95.3 kg)   BMI 31.01 kg/m   Constitutional:  Alert and oriented, No acute distress. HEENT: [place] AT, moist mucus membranes.  Trachea midline, no masses. Cardiovascular: No clubbing, cyanosis, or edema. Respiratory: Normal respiratory effort, no increased work of breathing. Rectal: Normal sphincter tone,  50+  CC prostate, smooth no nodules Skin: No rashes, bruises or suspicious lesions. Neurologic: Grossly intact, no focal deficits, moving all 4 extremities. Psychiatric: Normal mood and affect.  Laboratory Data:  Lab Results  Component Value Date   CREATININE 1.36 01/01/2021    Assessment & Plan:   Elevated PSA  - PSA relatively stable, within his baseline range and he had extensive work-up in the past - DRE benign - Reasonable to continue screening on a annual basis.  Follow-up in 1 year for PSA/DRE - PSA; Future  2. BPH without obstruction/lower urinary tract symptoms - Symptoms now improved on saw palmetto, continue this medication and monitor symptoms - We discussed alternative medications/ procedure he is not interested at this time.    Follow-up 1 year with IPSS/PVR/DRE/PSA  01/03/2021 Littlejohn,acting as a scribe for Jilda Panda, MD.,have documented all relevant documentation on the behalf of [name], MD,as directed by  [name], MD while in the presence of [name], MD.  I have reviewed the above documentation for accuracy and completeness, and I agree with the above.   [name], MD   [facility] [address], [address] [place], [place] [id number]

## 2021-12-05 ENCOUNTER — Encounter: Payer: BC Managed Care – PPO | Admitting: Orthopaedic Surgery

## 2021-12-12 ENCOUNTER — Other Ambulatory Visit: Payer: Self-pay

## 2021-12-12 ENCOUNTER — Emergency Department: Payer: BC Managed Care – PPO

## 2021-12-12 ENCOUNTER — Emergency Department
Admission: EM | Admit: 2021-12-12 | Discharge: 2021-12-12 | Disposition: A | Discharge status: Home / Self Care | Payer: BC Managed Care – PPO | Attending: Emergency Medicine | Admitting: Emergency Medicine

## 2021-12-12 DIAGNOSIS — R0789 Other chest pain: Secondary | ICD-10-CM | POA: Diagnosis not present

## 2021-12-12 DIAGNOSIS — R55 Syncope and collapse: Secondary | ICD-10-CM | POA: Diagnosis not present

## 2021-12-12 DIAGNOSIS — R202 Paresthesia of skin: Secondary | ICD-10-CM | POA: Insufficient documentation

## 2021-12-12 DIAGNOSIS — R079 Chest pain, unspecified: Secondary | ICD-10-CM | POA: Diagnosis not present

## 2021-12-12 DIAGNOSIS — R42 Dizziness and giddiness: Secondary | ICD-10-CM | POA: Insufficient documentation

## 2021-12-12 DIAGNOSIS — R0781 Pleurodynia: Secondary | ICD-10-CM | POA: Diagnosis not present

## 2021-12-12 DIAGNOSIS — R61 Generalized hyperhidrosis: Secondary | ICD-10-CM | POA: Diagnosis not present

## 2021-12-12 DIAGNOSIS — R0689 Other abnormalities of breathing: Secondary | ICD-10-CM | POA: Diagnosis not present

## 2021-12-12 DIAGNOSIS — M79601 Pain in right arm: Secondary | ICD-10-CM

## 2021-12-12 LAB — CBC
HCT: 45.4 % (ref 39.0–52.0)
Hemoglobin: 15.1 g/dL (ref 13.0–17.0)
MCH: 28.7 pg (ref 26.0–34.0)
MCHC: 33.3 g/dL (ref 30.0–36.0)
MCV: 86.3 fL (ref 80.0–100.0)
Platelets: 180 10*3/uL (ref 150–400)
RBC: 5.26 MIL/uL (ref 4.22–5.81)
RDW: 13.8 % (ref 11.5–15.5)
WBC: 4 10*3/uL (ref 4.0–10.5)
nRBC: 0 % (ref 0.0–0.2)

## 2021-12-12 LAB — BASIC METABOLIC PANEL
Anion gap: 12 (ref 5–15)
BUN: 17 mg/dL (ref 8–23)
CO2: 24 mmol/L (ref 22–32)
Calcium: 9.4 mg/dL (ref 8.9–10.3)
Chloride: 102 mmol/L (ref 98–111)
Creatinine, Ser: 1.57 mg/dL — ABNORMAL HIGH (ref 0.61–1.24)
GFR, Estimated: 50 mL/min — ABNORMAL LOW (ref >60)
Glucose, Bld: 105 mg/dL — ABNORMAL HIGH (ref 70–99)
Potassium: 3.5 mmol/L (ref 3.5–5.1)
Sodium: 138 mmol/L (ref 135–145)

## 2021-12-12 LAB — TROPONIN I (HIGH SENSITIVITY)
Troponin I (High Sensitivity): 5 ng/L (ref <18)
Troponin I (High Sensitivity): 6 ng/L (ref <18)

## 2021-12-12 NOTE — ED Triage Notes (Signed)
Pt to ED via [facility] from home. Pt reports he was at work at started feeling flush, dizziness and having tinging in his hands and feet. Pt also reports left sided rib pain. Pt reports pain has subsided. Pt reports cough x2 days.

## 2021-12-12 NOTE — ED Provider Notes (Signed)
[facility] Provider Note   Event Date/Time   First MD Initiated Contact with Patient 12/12/21 1500     (approximate) History  Chest Pain  HPI Colton Miller is a 61 y.o. male with a past medical history of hyperlipidemia and chronic knee pain who presents via EMS from work after an episode of diaphoresis, lightheadedness, paresthesias in the hands and feet, and left-sided rib pain.  Patient states that the pain is subsided at this time and he did so spontaneously over the next 15-20 minutes after the symptoms began.  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  Patient denies any exertional worsening of this left-sided pain.  Patient does state that when he attempted to get up and go to another room he felt extremely lightheaded and lost consciousness for a brief period.  Patient states that he does not feel that he fully went out however a coworker says that he had his eyes closed and was not responsive for approximately 5-10 seconds.  Patient immediately returned to baseline without neurologic deficits. ROS: Patient currently denies any vision changes, tinnitus, difficulty speaking, facial droop, sore throat, shortness of breath, abdominal pain, nausea/vomiting/diarrhea, dysuria, or weakness/numbness/paresthesias in any extremity   Physical Exam  Triage Vital Signs: ED Triage Vitals  Enc Vitals Group     BP 12/12/21 1114 117/84     Pulse Rate 12/12/21 1114 80     Resp 12/12/21 1114 18     Temp 12/12/21 1117 98.4 F (36.9 C)     Temp Source 12/12/21 1117 Oral     SpO2 12/12/21 1114 99 %     Weight 12/12/21 1455 209 lb 14.1 oz (95.2 kg)     Height 12/12/21 1455 5\' 9"  (1.753 m)     Head Circumference --      Peak Flow --      Pain Score --      Pain Loc --      Pain Edu? --      Excl. in GC? --    Most recent vital signs: Vitals:   12/12/21 1530 12/12/21 1545  BP:  124/80   Pulse: 77 64  Resp: (!) 24 17  Temp:    SpO2: 100% 100%   General: Awake, oriented x4. CV:  Good peripheral perfusion.  Resp:  Normal effort.  Abd:  No distention.  Other:  Middle-aged African-American male laying in bed in no acute distress.  NIHSS is 0 ED Results / Procedures / Treatments  Labs (all labs ordered are listed, but only abnormal results are displayed) Labs Reviewed  BASIC METABOLIC PANEL - Abnormal; Notable for the following components:      Result Value   Glucose, Bld 105 (*)    Creatinine, Ser 1.57 (*)    GFR, Estimated 50 (*)    All other components within normal limits  CBC  TROPONIN I (HIGH SENSITIVITY)  TROPONIN I (HIGH SENSITIVITY)   EKG ED ECG REPORT I, 02/11/22, the attending physician, personally viewed and interpreted this ECG. Date: 12/12/2021 EKG Time: 1118 Rate: 83 Rhythm: normal sinus rhythm QRS Axis: normal Intervals: normal ST/T Wave abnormalities: normal Narrative Interpretation: no evidence of acute ischemia RADIOLOGY ED MD interpretation: 2 view chest x-ray interpreted by me shows no evidence of acute abnormalities including no pneumonia, pneumothorax, or widened mediastinum -Agree with radiology assessment Official radiology report(s):  DG Chest 2 View  Result Date: 12/12/2021 CLINICAL DATA:  Chest pain EXAM: CHEST - 2 VIEW COMPARISON:  10/23/2015 FINDINGS: Cardiac and mediastinal contours are within normal limits. Aortic atherosclerosis. No focal pulmonary opacity. No pleural effusion or pneumothorax. No acute osseous abnormality. S shaped curvature of the thoracolumbar spine. IMPRESSION: No acute cardiopulmonary process. Electronically Signed   By: Wiliam Ke M.D.   On: 12/12/2021 11:34   PROCEDURES: Critical Care performed: No Procedures MEDICATIONS ORDERED IN ED: Medications - No data to display IMPRESSION / MDM / ASSESSMENT AND PLAN / ED COURSE  I reviewed the triage vital signs and the nursing notes.                              The patient is on the cardiac monitor to evaluate for evidence of arrhythmia and/or significant heart rate changes. Patient's presentation is most consistent with acute presentation with potential threat to life or bodily function. Patient presents with complaints of syncope/presyncope ED Workup:  CBC, BMP, Troponin, BNP, ECG, CXR Differential diagnosis includes HF, ICH, seizure, stroke, HOCM, ACS, aortic dissection, malignant arrhythmia, or GI bleed. Findings: No evidence of acute laboratory abnormalities.  Troponin negative x1 EKG: No e/o STEMI. No evidence of Brugadas sign, delta wave, epsilon wave, significantly prolonged QTc, or malignant arrhythmia.  Disposition: Discharge. Patient is at baseline at this time. Return precautions expressed and understood in person. Advised follow up with primary care provider or clinic physician in next 24 hours.   FINAL CLINICAL IMPRESSION(S) / ED DIAGNOSES   Final diagnoses:  Vasovagal syncope  Paresthesia and pain of both upper extremities  Lightheadedness  Nonspecific chest pain   Rx / DC Orders   ED Discharge Orders          Ordered    Ambulatory referral to Cardiology  Status:  Canceled       Comments: If you have not heard from the Cardiology office within the next 72 hours please call [phone].   12/12/21 1555    Ambulatory referral to Cardiology       Comments: If you have not heard from the Cardiology office within the next 72 hours please call [phone].   12/12/21 1556           Note:  This document was prepared using Dragon voice recognition software and may include unintentional dictation errors.   Merwyn Katos, MD 12/12/21 [phone]

## 2021-12-12 NOTE — ED Provider Triage Note (Signed)
Emergency Medicine Provider Triage Evaluation Note  Colton Miller , a 61 y.o. male  was evaluated in triage.  Pt complains of sudden onset of feeling flush, dizzy, tingling in both hands and legs, then stinging pain under his left breast area. Pain has resolved but tingling and numbness remains..  Physical Exam  There were no vitals taken for this visit. Gen:   Awake, no distress   Resp:  Normal effort  MSK:   Moves extremities without difficulty  Other:    Medical Decision Making  Medically screening exam initiated at 11:13 AM.  Appropriate orders placed.  Colton Miller was informed that the remainder of the evaluation will be completed by another provider, this initial triage assessment does not replace that evaluation, and the importance of remaining in the ED until their evaluation is complete.   Chinita Pester, FNP 12/12/21 1122

## 2021-12-12 NOTE — ED Triage Notes (Addendum)
error 

## 2021-12-12 NOTE — ED Notes (Signed)
Cardiac leads placed on patient.

## 2021-12-12 NOTE — ED Triage Notes (Signed)
Patient arrived by [facility] from work. C/o sudden onset left sided chest pain with sob. No radiation. Reports weakness in right leg and tingling in hands.   EMS vitals:  98% RA 90s HR A febrile 116/70 blood pressure  EMS administered 324 aspirin.

## 2021-12-18 ENCOUNTER — Telehealth: Payer: Self-pay

## 2021-12-18 NOTE — Telephone Encounter (Signed)
Spoke to pt to see how he was doing after recent ER visit for chest pain. He said they are wanting him to do a heart monitor which he has to call and set up. York Spaniel he was going to do that when we got off the phone. Otherwise, he said he was doing fine.

## 2021-12-22 DIAGNOSIS — Z03818 Encounter for observation for suspected exposure to other biological agents ruled out: Secondary | ICD-10-CM | POA: Diagnosis not present

## 2021-12-22 DIAGNOSIS — Z20822 Contact with and (suspected) exposure to covid-19: Secondary | ICD-10-CM | POA: Diagnosis not present

## 2022-01-14 ENCOUNTER — Ambulatory Visit (INDEPENDENT_AMBULATORY_CARE_PROVIDER_SITE_OTHER): Payer: BC Managed Care – PPO | Admitting: Internal Medicine

## 2022-01-14 ENCOUNTER — Encounter: Payer: Self-pay | Admitting: Internal Medicine

## 2022-01-14 VITALS — BP 118/80 | HR 68 | Temp 97.2°F | Ht 69.0 in | Wt 223.0 lb

## 2022-01-14 DIAGNOSIS — Z0000 Encounter for general adult medical examination without abnormal findings: Secondary | ICD-10-CM

## 2022-01-14 DIAGNOSIS — N4231 Prostatic intraepithelial neoplasia: Secondary | ICD-10-CM | POA: Diagnosis not present

## 2022-01-14 DIAGNOSIS — E785 Hyperlipidemia, unspecified: Secondary | ICD-10-CM

## 2022-01-14 DIAGNOSIS — Z23 Encounter for immunization: Secondary | ICD-10-CM

## 2022-01-14 LAB — LIPID PANEL
Cholesterol: 257 mg/dL — ABNORMAL HIGH (ref 0–200)
HDL: 62.1 mg/dL (ref >39.00)
LDL Cholesterol: 164 mg/dL — ABNORMAL HIGH (ref 0–99)
NonHDL: 194.5
Total CHOL/HDL Ratio: 4
Triglycerides: 155 mg/dL — ABNORMAL HIGH (ref 0.0–149.0)
VLDL: 31 mg/dL (ref 0.0–40.0)

## 2022-01-14 LAB — COMPREHENSIVE METABOLIC PANEL
ALT: 19 U/L (ref 0–53)
AST: 18 U/L (ref 0–37)
Albumin: 3.9 g/dL (ref 3.5–5.2)
Alkaline Phosphatase: 63 U/L (ref 39–117)
BUN: 15 mg/dL (ref 6–23)
CO2: 28 mEq/L (ref 19–32)
Calcium: 9.2 mg/dL (ref 8.4–10.5)
Chloride: 103 mEq/L (ref 96–112)
Creatinine, Ser: 1.27 mg/dL (ref 0.40–1.50)
GFR: 60.46 mL/min (ref >60.00)
Glucose, Bld: 77 mg/dL (ref 70–99)
Potassium: 3.9 mEq/L (ref 3.5–5.1)
Sodium: 140 mEq/L (ref 135–145)
Total Bilirubin: 1 mg/dL (ref 0.2–1.2)
Total Protein: 7.1 g/dL (ref 6.0–8.3)

## 2022-01-14 LAB — CBC
HCT: 39.7 % (ref 39.0–52.0)
Hemoglobin: 13.4 g/dL (ref 13.0–17.0)
MCHC: 33.7 g/dL (ref 30.0–36.0)
MCV: 87.3 fl (ref 78.0–100.0)
Platelets: 162 10*3/uL (ref 150.0–400.0)
RBC: 4.55 Mil/uL (ref 4.22–5.81)
RDW: 14.6 % (ref 11.5–15.5)
WBC: 4.3 10*3/uL (ref 4.0–10.5)

## 2022-01-14 LAB — TSH: TSH: 0.77 u[IU]/mL (ref 0.35–5.50)

## 2022-01-14 NOTE — Assessment & Plan Note (Signed)
Follows PSA with urologist
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
## 2022-01-14 NOTE — Assessment & Plan Note (Signed)
Discussed statin for primary prevention He is working on lifestyle Would consider statin

## 2022-01-14 NOTE — Addendum Note (Signed)
Addended by: Tillman Abide I on: 01/14/2022 10:53 AM   Modules accepted: Orders

## 2022-01-14 NOTE — Assessment & Plan Note (Signed)
Healthy Working on fitness Colon due 2029 Will get COVID and flu vaccines soon Shingrix #2 today

## 2022-01-14 NOTE — Addendum Note (Signed)
Addended by: Eual Fines on: 01/14/2022 11:39 AM   Modules accepted: Orders

## 2022-05-15 ENCOUNTER — Other Ambulatory Visit: Payer: Self-pay | Admitting: Internal Medicine

## 2022-05-18 MED ORDER — OXYCODONE-ACETAMINOPHEN 5-325 MG PO TABS: 1.0000 | ORAL_TABLET | Freq: Four times a day (QID) | ORAL | 0 refills | Status: AC | PRN

## 2022-05-18 NOTE — Telephone Encounter (Signed)
Last written 10-16-21 #40 Last OV/CPE 01-14-22 No Future [place]

## 2022-11-18 ENCOUNTER — Encounter: Payer: Self-pay | Admitting: Urology

## 2022-11-23 ENCOUNTER — Other Ambulatory Visit: Payer: BC Managed Care – PPO

## 2022-11-25 ENCOUNTER — Ambulatory Visit: Payer: Self-pay | Admitting: Urology

## 2022-11-25 ENCOUNTER — Encounter: Payer: Self-pay | Admitting: Urology

## 2023-05-18 ENCOUNTER — Encounter: Payer: Self-pay | Admitting: Internal Medicine

## 2024-04-16 ENCOUNTER — Other Ambulatory Visit: Payer: Self-pay

## 2024-04-16 ENCOUNTER — Emergency Department
Admission: EM | Admit: 2024-04-16 | Discharge: 2024-04-16 | Disposition: A | Discharge status: Home / Self Care | Payer: Self-pay | Attending: Emergency Medicine | Admitting: Emergency Medicine

## 2024-04-16 ENCOUNTER — Emergency Department: Payer: Self-pay

## 2024-04-16 DIAGNOSIS — R55 Syncope and collapse: Secondary | ICD-10-CM | POA: Insufficient documentation

## 2024-04-16 DIAGNOSIS — R1013 Epigastric pain: Secondary | ICD-10-CM | POA: Insufficient documentation

## 2024-04-16 DIAGNOSIS — R0789 Other chest pain: Secondary | ICD-10-CM | POA: Insufficient documentation

## 2024-04-16 HISTORY — DX: Essential (primary) hypertension: I10

## 2024-04-16 LAB — CBC
HCT: 45.6 % (ref 39.0–52.0)
Hemoglobin: 15.1 g/dL (ref 13.0–17.0)
MCH: 28.8 pg (ref 26.0–34.0)
MCHC: 33.1 g/dL (ref 30.0–36.0)
MCV: 87 fL (ref 80.0–100.0)
Platelets: 207 K/uL (ref 150–400)
RBC: 5.24 MIL/uL (ref 4.22–5.81)
RDW: 14.5 % (ref 11.5–15.5)
WBC: 4.1 K/uL (ref 4.0–10.5)
nRBC: 0 % (ref 0.0–0.2)

## 2024-04-16 LAB — HEPATIC FUNCTION PANEL
ALT: 25 U/L (ref 0–44)
AST: 32 U/L (ref 15–41)
Albumin: 4.5 g/dL (ref 3.5–5.0)
Alkaline Phosphatase: 65 U/L (ref 38–126)
Bilirubin, Direct: 0.3 mg/dL — ABNORMAL HIGH (ref 0.0–0.2)
Indirect Bilirubin: 0.7 mg/dL (ref 0.3–0.9)
Total Bilirubin: 1 mg/dL (ref 0.0–1.2)
Total Protein: 7.7 g/dL (ref 6.5–8.1)

## 2024-04-16 LAB — BASIC METABOLIC PANEL WITH GFR
Anion gap: 15 (ref 5–15)
BUN: 16 mg/dL (ref 8–23)
CO2: 21 mmol/L — ABNORMAL LOW (ref 22–32)
Calcium: 9.8 mg/dL (ref 8.9–10.3)
Chloride: 104 mmol/L (ref 98–111)
Creatinine, Ser: 1.37 mg/dL — ABNORMAL HIGH (ref 0.61–1.24)
GFR, Estimated: 58 mL/min — ABNORMAL LOW (ref >60)
Glucose, Bld: 121 mg/dL — ABNORMAL HIGH (ref 70–99)
Potassium: 3.3 mmol/L — ABNORMAL LOW (ref 3.5–5.1)
Sodium: 139 mmol/L (ref 135–145)

## 2024-04-16 LAB — LIPASE, BLOOD: Lipase: 34 U/L (ref 11–51)

## 2024-04-16 LAB — TROPONIN T, HIGH SENSITIVITY
Troponin T High Sensitivity: <15 ng/L (ref 0–19)
Troponin T High Sensitivity: <15 ng/L (ref 0–19)

## 2024-04-16 MED ORDER — FAMOTIDINE 20 MG PO TABS
20.0000 mg | ORAL_TABLET | Freq: Once | ORAL | Status: AC
  Administered 2024-04-16: 20 mg via ORAL
  Filled 2024-04-16: qty 1

## 2024-04-16 NOTE — ED Triage Notes (Signed)
 Pt to ED POV for chest pain since this AM that got worse today during church. Pain is mid sternal, sharp. Pt nauseous. States pain is worse with inspiration. Blue top sent.

## 2024-04-16 NOTE — ED Provider Notes (Signed)
 [facility] Provider Note    Event Date/Time   First MD Initiated Contact with Patient 04/16/24 1402     (approximate)   History   Chest Pain   HPI  Colton Miller is a 63 y.o. male with a history of hyperlipidemia who presents with chest/epigastric pain, acute onset around 49 AM when he was in church, lasting about 15 or 20 minutes, and now mostly resolved.  The patient reports mild epigastric discomfort at this time.  He had some nausea when it happened, and then felt very lightheaded and almost like he might pass out, however he did not pass out.  He did have some shortness of breath at that time as well.  He denies any leg pain or swelling.  The symptoms have all resolved.  He states that this morning he did not take his blood pressure medication and did not have breakfast; he only drank coffee.  He states otherwise he has been in his usual state of health.    I reviewed the past medical records.  The patient's most recent outpatient encounter was on 9/13 with primary care for an annual physical.   Physical Exam   Triage Vital Signs: ED Triage Vitals  Encounter Vitals Group     BP 04/16/24 1148 (!) 149/98     Girls Systolic BP Percentile --      Girls Diastolic BP Percentile --      Boys Systolic BP Percentile --      Boys Diastolic BP Percentile --      Pulse Rate 04/16/24 1148 86     Resp 04/16/24 1148 (!) 24     Temp 04/16/24 1148 (!) 97.5 F (36.4 C)     Temp Source 04/16/24 1148 Oral     SpO2 04/16/24 1148 100 %     Weight 04/16/24 1147 215 lb (97.5 kg)     Height 04/16/24 1147 5' 10 (1.778 m)     Head Circumference --      Peak Flow --      Pain Score 04/16/24 1145 7     Pain Loc --      Pain Education --      Exclude from Growth Chart --     Most recent vital signs: Vitals:   04/16/24 1148 04/16/24 1513  BP: (!) 149/98 (!) 150/102  Pulse: 86 80  Resp: (!) 24 20  Temp: (!) 97.5 F (36.4 C) 97.8 F (36.6 C)  SpO2: 100% 100%      General: Alert, well-appearing, no distress.  CV:  Good peripheral perfusion.  Normal heart sounds. Resp:  Normal effort.  Lungs CTAB. Abd:  No distention.  Mild epigastric discomfort to palpation.  No focal tenderness. Other:  No peripheral edema.   ED Results / Procedures / Treatments   Labs (all labs ordered are listed, but only abnormal results are displayed) Labs Reviewed  BASIC METABOLIC PANEL WITH GFR - Abnormal; Notable for the following components:      Result Value   Potassium 3.3 (*)    CO2 21 (*)    Glucose, Bld 121 (*)    Creatinine, Ser 1.37 (*)    GFR, Estimated 58 (*)    All other components within normal limits  HEPATIC FUNCTION PANEL - Abnormal; Notable for the following components:   Bilirubin, Direct 0.3 (*)    All other components within normal limits  CBC  LIPASE, BLOOD  TROPONIN T, HIGH SENSITIVITY  TROPONIN  T, HIGH SENSITIVITY     EKG  ED ECG REPORT I, Waylon Cassis, the attending physician, personally viewed and interpreted this ECG.  Date: 04/16/2024 EKG Time: 1143 Rate: 94 Rhythm: normal sinus rhythm with PVCs QRS Axis: normal Intervals: normal ST/T Wave abnormalities: Nonspecific T wave abnormality Narrative Interpretation: no evidence of acute ischemia    RADIOLOGY  Chest x-ray: I independently viewed and interpreted the images; there is no focal consolidation or edema  PROCEDURES:  Critical Care performed: No  Procedures   MEDICATIONS ORDERED IN ED: Medications  famotidine  (PEPCID ) tablet 20 mg (20 mg Oral Given 04/16/24 1535)     IMPRESSION / MDM / ASSESSMENT AND PLAN / ED COURSE  I reviewed the triage vital signs and the nursing notes.  63 year old male with PMH as noted above presents with an episode of epigastric/chest pain that started while he was at church and was associated with near syncope.  The symptoms have now resolved although he still reports mild epigastric discomfort.  On exam he is overall  well-appearing.  He is slightly hypertensive.  Other vital signs are normal.  Physical exam is unremarkable for acute findings.  EKG is nonischemic.  Differential diagnosis includes, but is not limited to, GERD, gastritis, gastroparesis, gastroenteritis, less likely ACS.  There is no clinical evidence of PE given the resolved symptoms.  The presentation is also not consistent with aortic dissection or other vascular cause.  We will obtain basic labs, cardiac enzymes, chest x-ray, give Pepcid , and reassess.  Patient's presentation is most consistent with acute complicated illness / injury requiring diagnostic workup.  ----------------------------------------- 4:09 PM on 04/16/2024 -----------------------------------------  BMP shows a mildly elevated creatinine but no acute findings.  CBC is normal.  Troponins are negative x 2.  LFTs and lipase are both within normal limits.  Chest x-ray is clear.  The patient has had no recurrence of the pain.  He feels well and is comfortable.  He is stable for discharge home at this time.  Overall I suspect gastritis, GERD, or other GI etiology.  I advised the patient to take OTC Pepcid .  I gave him strict return precautions, and he expressed understanding.   FINAL CLINICAL IMPRESSION(S) / ED DIAGNOSES   Final diagnoses:  Atypical chest pain  Epigastric pain  Near syncope     Rx / DC Orders   ED Discharge Orders     None        Note:  This document was prepared using Dragon voice recognition software and may include unintentional dictation errors.    Cassis Waylon, MD 04/16/24 1610

## 2024-04-16 NOTE — ED Notes (Signed)
 See triage note  Presents with some chest discomfort  States he developed this yesterday with some SOB    Then it passed Started again while in church Discomfort is increased with inspiration

## 2024-04-16 NOTE — Discharge Instructions (Signed)
 We suspect that your chest and upper abdominal pain were likely more due to stomach acid rather than a cardiac cause.  Your cardiac workup is reassuring.  You may take Pepcid  (famotidine ) 20 mg which is available over-the-counter up to twice daily over the next several days, and eat a bland diet for the next several days.  Follow-up with your primary care provider.  Return to the ER immediately for new, worsening, or persistent severe chest or abdominal pain, vomiting, shortness of breath, dizziness or lightheadedness, feeling like you are going to pass out, or any other new or worsening symptoms that concern you.

## 2024-05-16 ENCOUNTER — Ambulatory Visit: Payer: Self-pay | Admitting: Nurse Practitioner

## 2024-05-16 ENCOUNTER — Encounter: Payer: Self-pay | Admitting: Nurse Practitioner

## 2024-05-16 VITALS — BP 118/76 | HR 82 | Temp 98.0°F | Ht 69.0 in | Wt 229.4 lb

## 2024-05-16 DIAGNOSIS — R600 Localized edema: Secondary | ICD-10-CM

## 2024-05-16 DIAGNOSIS — R0789 Other chest pain: Secondary | ICD-10-CM | POA: Diagnosis not present

## 2024-05-16 DIAGNOSIS — I10 Essential (primary) hypertension: Secondary | ICD-10-CM

## 2024-05-16 DIAGNOSIS — D3192 Benign neoplasm of unspecified part of left eye: Secondary | ICD-10-CM | POA: Diagnosis not present

## 2024-05-16 DIAGNOSIS — R1013 Epigastric pain: Secondary | ICD-10-CM

## 2024-05-16 DIAGNOSIS — Z23 Encounter for immunization: Secondary | ICD-10-CM

## 2024-05-16 DIAGNOSIS — R252 Cramp and spasm: Secondary | ICD-10-CM

## 2024-05-16 DIAGNOSIS — Z09 Encounter for follow-up examination after completed treatment for conditions other than malignant neoplasm: Secondary | ICD-10-CM

## 2024-05-16 LAB — CBC
HCT: 38 % — ABNORMAL LOW (ref 39.0–52.0)
Hemoglobin: 13 g/dL (ref 13.0–17.0)
MCHC: 34.3 g/dL (ref 30.0–36.0)
MCV: 85.8 fl (ref 78.0–100.0)
Platelets: 166 K/uL (ref 150.0–400.0)
RBC: 4.43 Mil/uL (ref 4.22–5.81)
RDW: 14.3 % (ref 11.5–15.5)
WBC: 5.6 K/uL (ref 4.0–10.5)

## 2024-05-16 LAB — LIPID PANEL
Cholesterol: 204 mg/dL — ABNORMAL HIGH (ref 28–200)
HDL: 58.9 mg/dL
LDL Cholesterol: 101 mg/dL — ABNORMAL HIGH (ref 10–99)
NonHDL: 144.82
Total CHOL/HDL Ratio: 3
Triglycerides: 217 mg/dL — ABNORMAL HIGH (ref 10.0–149.0)
VLDL: 43.4 mg/dL — ABNORMAL HIGH (ref 0.0–40.0)

## 2024-05-16 LAB — MAGNESIUM: Magnesium: 1.9 mg/dL (ref 1.5–2.5)

## 2024-05-16 LAB — COMPREHENSIVE METABOLIC PANEL WITH GFR
ALT: 47 U/L (ref 3–53)
AST: 33 U/L (ref 5–37)
Albumin: 4.1 g/dL (ref 3.5–5.2)
Alkaline Phosphatase: 64 U/L (ref 39–117)
BUN: 13 mg/dL (ref 6–23)
CO2: 27 meq/L (ref 19–32)
Calcium: 9.1 mg/dL (ref 8.4–10.5)
Chloride: 104 meq/L (ref 96–112)
Creatinine, Ser: 1.31 mg/dL (ref 0.40–1.50)
GFR: 57.3 mL/min — ABNORMAL LOW
Glucose, Bld: 96 mg/dL (ref 70–99)
Potassium: 3.9 meq/L (ref 3.5–5.1)
Sodium: 140 meq/L (ref 135–145)
Total Bilirubin: 0.8 mg/dL (ref 0.2–1.2)
Total Protein: 6.8 g/dL (ref 6.0–8.3)

## 2024-05-16 LAB — TSH: TSH: 0.94 u[IU]/mL (ref 0.35–5.50)

## 2024-05-16 LAB — BRAIN NATRIURETIC PEPTIDE: Pro B Natriuretic peptide (BNP): 39 pg/mL (ref 1.0–100.0)

## 2024-05-16 MED ORDER — OMEPRAZOLE 20 MG PO CPDR: 20.0000 mg | DELAYED_RELEASE_CAPSULE | Freq: Every day | ORAL | 3 refills | Status: AC

## 2024-05-16 NOTE — Patient Instructions (Signed)
 Nice to se you today  I will be in touch with the labs once I have them  We did update your flu vaccine today  Follow up as you need me

## 2024-05-16 NOTE — Progress Notes (Signed)
 "  Established Patient Office Visit  Subjective   Patient ID: Colton Miller, male    DOB: [birth year]  Age: 64 y.o. MRN: [id number]  Chief Complaint  Patient presents with   Transitions Of Care    Pt complains of heart issues. States of cramping in chest, arm and legs.     HPI  HTN: Patient currently maintained on amlodipine 5 mg daily [facility]  HLD: Currently maintained on atorvastatin  40 mg daily.  [facility] is [facility] by [facility]  BPH: states that he does see urlogy at [facility]. States that he was having flow   Immunizations: -Tetanus: Completed in 2021 -Influenza:refused  -Shingles: Completed Shingrix  series -Pneumonia:? Colonoscopy: Completed in 06/17/2017, repeat 10 years.  Patient due 2029  PSA: followed by [facility]  Discussed the use of AI scribe software for clinical note transcription with the patient, who gave verbal consent to proceed.  History of Present Illness Colton Miller is a 64 year old male with hypertension and hyperlipidemia who presents with episodes of dizziness and shortness of breath.  He experiences episodes of dizziness and shortness of breath, with the most recent episode occurring about a month ago. He describes feeling very dizzy, almost to the point of syncope, and experiencing palpitations. These episodes have occurred approximately three to four times over the past five years, with one instance resulting in loss of consciousness. They can occur without any specific trigger, although he sometimes feels them coming on when feeling fatigued.  He has a history of hypertension and hyperlipidemia, managed with amlodipine and atorvastatin , respectively. He also uses saw palmetto and an over-the-counter supplement called uremia zinc for prostate health, which he finds effective in improving urinary flow.  He experiences occasional gastrointestinal discomfort, particularly at night, with symptoms of pain beneath the chest bone and increased eructation. He  reports that famotidine  helped his symptoms. He also reports experiencing cramps, which he attributes to dehydration and has been taking potassium supplements.  He has a history of a torn meniscus in his knee, diagnosed via MRI at the [facility] hospital. Surgery has been delayed due to scheduling issues at the [facility]. He uses diclofenac gel for knee pain management.  He reports a family history of stroke in his mother, heart disease in his father, and cancer in his paternal aunt and grandmother. He denies any illicit drug use, uses alcohol socially, and does not use tobacco products. He is retired, separated, and has one son born in 20. He tries to drink at least two bottles of water a day but acknowledges that he often forgets to eat or drink regularly throughout the day. He avoids fried foods, sodas, and sweets, and prefers water, tea, and fruit punch. He drinks coffee regularly.        Review of Systems  Constitutional:  Negative for chills and fever.  Respiratory:  Positive for shortness of breath.   Cardiovascular:  Positive for chest pain.  Gastrointestinal:  Positive for nausea and vomiting.  Neurological:  Positive for dizziness.      Objective:     BP 118/76   Pulse 82   Temp 98 F (36.7 C) (Oral)   Ht 5' 9 (1.753 m)   Wt 229 lb 6.4 oz (104.1 kg)   SpO2 98%   BMI 33.88 kg/m    Physical Exam Vitals and nursing note reviewed.  Constitutional:      Appearance: Normal appearance.  HENT:     Right Ear: Tympanic membrane, ear  canal and external ear normal.     Left Ear: Tympanic membrane, ear canal and external ear normal.     Mouth/Throat:     Mouth: Mucous membranes are moist.     Pharynx: Oropharynx is clear.  Eyes:     Extraocular Movements: Extraocular movements intact.     Pupils: Pupils are equal, round, and reactive to light.   Cardiovascular:     Rate and Rhythm: Normal rate and regular rhythm.     Pulses: Normal pulses.     Heart sounds: Normal heart sounds.   Pulmonary:     Effort: Pulmonary effort is normal.     Breath sounds: Normal breath sounds.  Abdominal:     General: Bowel sounds are normal. There is no distension.     Palpations: There is no mass.     Tenderness: There is abdominal tenderness in the epigastric area.     Hernia: No hernia is present.  Musculoskeletal:     Right lower leg: No edema.     Left lower leg: No edema.  Lymphadenopathy:     Cervical: No cervical adenopathy.  Skin:    General: Skin is warm.  Neurological:     General: No focal deficit present.     Mental Status: He is alert.     Deep Tendon Reflexes:     Reflex Scores:      Bicep reflexes are 2+ on the right side and 2+ on the left side.      Patellar reflexes are 2+ on the right side and 2+ on the left side.    Comments: Bilateral upper and lower extremity strength 5/5  Psychiatric:        Mood and Affect: Mood normal.        Behavior: Behavior normal.        Thought Content: Thought content normal.        Judgment: Judgment normal.      Results for orders placed or performed in visit on 05/16/24  CBC  Result Value Ref Range   WBC 5.6 4.0 - 10.5 K/uL   RBC 4.43 4.22 - 5.81 Mil/uL   Platelets 166.0 150.0 - 400.0 K/uL   Hemoglobin 13.0 13.0 - 17.0 g/dL   HCT 61.9 (L) 60.9 - 47.9 %   MCV 85.8 78.0 - 100.0 fl   MCHC 34.3 30.0 - 36.0 g/dL   RDW 85.6 88.4 - 84.4 %  Comprehensive metabolic panel with GFR  Result Value Ref Range   Sodium 140 135 - 145 mEq/L   Potassium 3.9 3.5 - 5.1 mEq/L   Chloride 104 96 - 112 mEq/L   CO2 27 19 - 32 mEq/L   Glucose, Bld 96 70 - 99 mg/dL   BUN 13 6 - 23 mg/dL   Creatinine, Ser 8.68 0.40 - 1.50 mg/dL   Total Bilirubin 0.8 0.2 - 1.2 mg/dL   Alkaline Phosphatase 64 39 - 117 U/L   AST 33 5 - 37 U/L   ALT 47 3 - 53 U/L   Total Protein 6.8 6.0 - 8.3 g/dL   Albumin 4.1 3.5 - 5.2 g/dL   GFR 42.69 (L) >39.99 mL/min   Calcium  9.1 8.4 - 10.5 mg/dL  Lipid panel  Result Value Ref Range   Cholesterol 204 (H) 28 -  200 mg/dL   Triglycerides 782.9 (H) 10.0 - 149.0 mg/dL   HDL 41.09 >60.99 mg/dL   VLDL 56.5 (H) 0.0 - 59.9 mg/dL   LDL Cholesterol 898 (H)  10 - 99 mg/dL   Total CHOL/HDL Ratio 3    NonHDL 144.82   Magnesium  Result Value Ref Range   Magnesium 1.9 1.5 - 2.5 mg/dL  Brain natriuretic peptide  Result Value Ref Range   Pro B Natriuretic peptide (BNP) 39.0 1.0 - 100.0 pg/mL  TSH  Result Value Ref Range   TSH 0.94 0.35 - 5.50 uIU/mL      The 10-year ASCVD risk score (Arnett DK, et al., 2019) is: 13.4%    Assessment & Plan:   Problem List Items Addressed This Visit   None Visit Diagnoses       Epigastric pain    -  Primary   Relevant Medications   omeprazole  (PRILOSEC) 20 MG capsule   Other Relevant Orders   H. pylori breath test     Atypical chest pain       Relevant Orders   CBC (Completed)   Comprehensive metabolic panel with GFR (Completed)   Lipid panel (Completed)   Lipoprotein A (LPA)     Hospital discharge follow-up         Muscle cramping       Relevant Orders   Magnesium (Completed)     Primary hypertension       Relevant Medications   amLODipine (NORVASC) 5 MG tablet   atorvastatin  (LIPITOR) 40 MG tablet   Other Relevant Orders   CBC (Completed)   Comprehensive metabolic panel with GFR (Completed)   Lipid panel (Completed)   Brain natriuretic peptide (Completed)   TSH (Completed)     Lower extremity edema       Relevant Orders   Brain natriuretic peptide (Completed)     Benign growth on outer layer of left eye       Relevant Orders   Ambulatory referral to Ophthalmology     Need for influenza vaccination       Relevant Orders   Flu vaccine HIGH DOSE PF(Fluzone Trivalent) (Completed)     Assessment and Plan Assessment & Plan Gastritis Intermittent chest pain and dyspnea likely due to gastritis, not cardiac issues. Symptoms include nocturnal pain, sweating, and pre-syncope. Differential includes severe acid reflux. Tenderness below rib cage  suggests gastritis. Cardiac causes ruled out in ER. - Ordered H. pylori breath test. - Prescribed omeprazole  daily for 30 days. - Ordered blood work including electrolytes. - Advised increased water intake.  Hypertension Managed with amlodipine. No recent changes in blood pressure control. - Continue amlodipine. - bilateral lower extremity swelling. Check BNP  Hyperlipidemia Managed with atorvastatin . Recent cholesterol levels slightly elevated but controlled with medication. - Continue atorvastatin . - Ordered lipid panel including lipoprotein A.  Benign prostatic hyperplasia Symptoms improved with saw palmetto and urinazine. - Continue saw palmetto and urinazine. -[facility]ed by the [facility]  General Health Maintenance Immunizations up to date except for flu and pneumonia vaccines. Colonoscopy due in 2029. - Administered flu shot. - Discussed pneumonia vaccine. - colonoscopy due for 2029.   Return if symptoms worsen or fail to improve.    Adina Crandall, NP  "

## 2024-05-21 ENCOUNTER — Ambulatory Visit: Payer: Self-pay | Admitting: Nurse Practitioner

## 2024-05-23 LAB — LIPOPROTEIN A (LPA): Lipoprotein (a): 182 nmol/L — ABNORMAL HIGH

## 2024-05-23 LAB — H. PYLORI BREATH TEST: H. pylori Breath Test: NOT DETECTED

## 2024-06-01 ENCOUNTER — Telehealth: Payer: Self-pay | Admitting: Nurse Practitioner

## 2024-06-01 NOTE — Telephone Encounter (Signed)
-----   Message from [facility] sent at 05/16/2024 11:11 AM EST ----- Regarding: epigastric pain See if omeprazole  has helped with his pain

## 2024-06-01 NOTE — Telephone Encounter (Signed)
 See if omeprazole  has helped with his pain

## 2024-06-02 NOTE — Telephone Encounter (Signed)
 Left detailed voicemail for patient to call the office back.
